# Patient Record
Sex: Female | Born: 1945 | Race: Black or African American | Hispanic: No | Marital: Single | Smoking: Never smoker
Health system: Southern US, Community
[De-identification: ages and names within clinical notes are randomized; demographics above are authoritative.]

## PROBLEM LIST (undated history)

## (undated) DIAGNOSIS — I639 Cerebral infarction, unspecified: Secondary | ICD-10-CM

## (undated) DIAGNOSIS — Z7901 Long term (current) use of anticoagulants: Secondary | ICD-10-CM

## (undated) DIAGNOSIS — M8580 Other specified disorders of bone density and structure, unspecified site: Secondary | ICD-10-CM

## (undated) DIAGNOSIS — D649 Anemia, unspecified: Secondary | ICD-10-CM

## (undated) DIAGNOSIS — Z87442 Personal history of urinary calculi: Secondary | ICD-10-CM

## (undated) DIAGNOSIS — D219 Benign neoplasm of connective and other soft tissue, unspecified: Secondary | ICD-10-CM

## (undated) DIAGNOSIS — J309 Allergic rhinitis, unspecified: Secondary | ICD-10-CM

## (undated) DIAGNOSIS — E7800 Pure hypercholesterolemia, unspecified: Secondary | ICD-10-CM

## (undated) DIAGNOSIS — I4891 Unspecified atrial fibrillation: Secondary | ICD-10-CM

## (undated) DIAGNOSIS — M1990 Unspecified osteoarthritis, unspecified site: Secondary | ICD-10-CM

## (undated) HISTORY — PX: COLONOSCOPY: SHX174

## (undated) HISTORY — PX: LAMINECTOMY: SHX219

## (undated) HISTORY — DX: Unspecified osteoarthritis, unspecified site: M19.90

## (undated) HISTORY — DX: Benign neoplasm of connective and other soft tissue, unspecified: D21.9

## (undated) HISTORY — PX: EYE SURGERY: SHX253

## (undated) HISTORY — DX: Other specified disorders of bone density and structure, unspecified site: M85.80

## (undated) HISTORY — DX: Anemia, unspecified: D64.9

## (undated) HISTORY — DX: Pure hypercholesterolemia, unspecified: E78.00

## (undated) HISTORY — PX: BACK SURGERY: SHX140

## (undated) HISTORY — DX: Cerebral infarction, unspecified: I63.9

## (undated) HISTORY — DX: Allergic rhinitis, unspecified: J30.9

## (undated) HISTORY — DX: Unspecified atrial fibrillation: I48.91

---

## 2005-11-03 ENCOUNTER — Ambulatory Visit: Payer: Self-pay | Admitting: Obstetrics and Gynecology

## 2005-12-01 ENCOUNTER — Ambulatory Visit: Payer: Self-pay | Admitting: Obstetrics and Gynecology

## 2007-11-14 ENCOUNTER — Ambulatory Visit: Payer: Self-pay | Admitting: Obstetrics and Gynecology

## 2008-06-05 ENCOUNTER — Ambulatory Visit: Payer: Self-pay | Admitting: Unknown Physician Specialty

## 2009-02-04 ENCOUNTER — Ambulatory Visit: Payer: Self-pay | Admitting: Internal Medicine

## 2009-02-27 ENCOUNTER — Ambulatory Visit: Payer: Self-pay | Admitting: Internal Medicine

## 2009-03-07 ENCOUNTER — Ambulatory Visit: Payer: Self-pay | Admitting: Internal Medicine

## 2009-04-06 ENCOUNTER — Ambulatory Visit: Payer: Self-pay | Admitting: Internal Medicine

## 2009-04-15 ENCOUNTER — Ambulatory Visit: Payer: Self-pay | Admitting: Obstetrics and Gynecology

## 2012-01-28 ENCOUNTER — Ambulatory Visit: Payer: Self-pay | Admitting: Obstetrics and Gynecology

## 2013-03-28 ENCOUNTER — Ambulatory Visit: Payer: Self-pay | Admitting: Family Medicine

## 2013-08-25 ENCOUNTER — Ambulatory Visit: Payer: Self-pay | Admitting: Gastroenterology

## 2014-02-28 DIAGNOSIS — E7800 Pure hypercholesterolemia, unspecified: Secondary | ICD-10-CM | POA: Insufficient documentation

## 2014-02-28 DIAGNOSIS — I639 Cerebral infarction, unspecified: Secondary | ICD-10-CM

## 2014-02-28 DIAGNOSIS — D649 Anemia, unspecified: Secondary | ICD-10-CM

## 2014-02-28 HISTORY — DX: Pure hypercholesterolemia, unspecified: E78.00

## 2014-02-28 HISTORY — DX: Anemia, unspecified: D64.9

## 2014-02-28 HISTORY — DX: Cerebral infarction, unspecified: I63.9

## 2014-04-25 ENCOUNTER — Ambulatory Visit: Payer: Self-pay | Admitting: Family Medicine

## 2014-12-07 HISTORY — PX: OOPHORECTOMY: SHX86

## 2015-03-21 ENCOUNTER — Inpatient Hospital Stay
Admit: 2015-03-21 | Disposition: A | Discharge status: Skilled Nursing Facility | Payer: Self-pay | Attending: Surgery | Admitting: Surgery

## 2015-03-21 HISTORY — PX: ORIF FEMUR FRACTURE: SHX2119

## 2015-03-21 LAB — BASIC METABOLIC PANEL
Anion Gap: 6 — ABNORMAL LOW (ref 7–16)
BUN: 19 mg/dL
CREATININE: 0.83 mg/dL
Calcium, Total: 8.7 mg/dL — ABNORMAL LOW
Chloride: 106 mmol/L
Co2: 27 mmol/L
EGFR (African American): >60
EGFR (Non-African Amer.): >60
Glucose: 120 mg/dL — ABNORMAL HIGH
Potassium: 4.2 mmol/L
Sodium: 139 mmol/L

## 2015-03-21 LAB — CBC
HCT: 39 % (ref 35.0–47.0)
HGB: 12.4 g/dL (ref 12.0–16.0)
MCH: 28.2 pg (ref 26.0–34.0)
MCHC: 31.7 g/dL — AB (ref 32.0–36.0)
MCV: 89 fL (ref 80–100)
Platelet: 149 10*3/uL — ABNORMAL LOW (ref 150–440)
RBC: 4.38 10*6/uL (ref 3.80–5.20)
RDW: 13.3 % (ref 11.5–14.5)
WBC: 6.3 10*3/uL (ref 3.6–11.0)

## 2015-03-21 LAB — PROTIME-INR
INR: 1.5
Prothrombin Time: 17.9 secs — ABNORMAL HIGH

## 2015-03-21 LAB — APTT: Activated PTT: 25.8 secs (ref 23.6–35.9)

## 2015-03-22 LAB — BASIC METABOLIC PANEL
ANION GAP: 0 — AB (ref 7–16)
BUN: 12 mg/dL
CREATININE: 0.73 mg/dL
Calcium, Total: 7.9 mg/dL — ABNORMAL LOW
Chloride: 113 mmol/L — ABNORMAL HIGH
Co2: 26 mmol/L
EGFR (African American): >60
GLUCOSE: 139 mg/dL — AB
Potassium: 4.3 mmol/L
Sodium: 139 mmol/L

## 2015-03-22 LAB — CBC WITH DIFFERENTIAL/PLATELET
BASOS PCT: 0 %
Basophil #: 0 10*3/uL (ref 0.0–0.1)
Eosinophil #: 0 10*3/uL (ref 0.0–0.7)
Eosinophil %: 0 %
HCT: 28.5 % — AB (ref 35.0–47.0)
HGB: 9.4 g/dL — AB (ref 12.0–16.0)
LYMPHS ABS: 0.8 10*3/uL — AB (ref 1.0–3.6)
LYMPHS PCT: 13 %
MCH: 29 pg (ref 26.0–34.0)
MCHC: 32.8 g/dL (ref 32.0–36.0)
MCV: 88 fL (ref 80–100)
Monocyte #: 0.5 x10 3/mm (ref 0.2–0.9)
Monocyte %: 8.3 %
Neutrophil #: 5 10*3/uL (ref 1.4–6.5)
Neutrophil %: 78.7 %
Platelet: 109 10*3/uL — ABNORMAL LOW (ref 150–440)
RBC: 3.23 10*6/uL — ABNORMAL LOW (ref 3.80–5.20)
RDW: 13.4 % (ref 11.5–14.5)
WBC: 6.3 10*3/uL (ref 3.6–11.0)

## 2015-03-22 LAB — PROTIME-INR
INR: 1.9
Prothrombin Time: 21.5 secs — ABNORMAL HIGH

## 2015-03-23 LAB — CBC WITH DIFFERENTIAL/PLATELET
Basophil #: 0 10*3/uL (ref 0.0–0.1)
Basophil %: 0.4 %
Eosinophil #: 0 10*3/uL (ref 0.0–0.7)
Eosinophil %: 0.3 %
HCT: 28.6 % — ABNORMAL LOW (ref 35.0–47.0)
HGB: 9.2 g/dL — AB (ref 12.0–16.0)
Lymphocyte #: 2.4 10*3/uL (ref 1.0–3.6)
Lymphocyte %: 34.7 %
MCH: 28.8 pg (ref 26.0–34.0)
MCHC: 32.2 g/dL (ref 32.0–36.0)
MCV: 89 fL (ref 80–100)
MONO ABS: 0.6 x10 3/mm (ref 0.2–0.9)
Monocyte %: 8.2 %
NEUTROS ABS: 3.9 10*3/uL (ref 1.4–6.5)
NEUTROS PCT: 56.4 %
Platelet: 110 10*3/uL — ABNORMAL LOW (ref 150–440)
RBC: 3.2 10*6/uL — ABNORMAL LOW (ref 3.80–5.20)
RDW: 14 % (ref 11.5–14.5)
WBC: 6.9 10*3/uL (ref 3.6–11.0)

## 2015-03-23 LAB — PROTIME-INR
INR: 3.1
Prothrombin Time: 32.1 secs — ABNORMAL HIGH

## 2015-03-24 LAB — CBC WITH DIFFERENTIAL/PLATELET
BASOS PCT: 0.4 %
Basophil #: 0 10*3/uL (ref 0.0–0.1)
EOS ABS: 0 10*3/uL (ref 0.0–0.7)
Eosinophil %: 0.4 %
HCT: 25.8 % — ABNORMAL LOW (ref 35.0–47.0)
HGB: 8.3 g/dL — ABNORMAL LOW (ref 12.0–16.0)
LYMPHS PCT: 27.5 %
Lymphocyte #: 1.8 10*3/uL (ref 1.0–3.6)
MCH: 28.5 pg (ref 26.0–34.0)
MCHC: 32.4 g/dL (ref 32.0–36.0)
MCV: 88 fL (ref 80–100)
Monocyte #: 0.7 x10 3/mm (ref 0.2–0.9)
Monocyte %: 10.3 %
Neutrophil #: 3.9 10*3/uL (ref 1.4–6.5)
Neutrophil %: 61.4 %
Platelet: 106 10*3/uL — ABNORMAL LOW (ref 150–440)
RBC: 2.93 10*6/uL — AB (ref 3.80–5.20)
RDW: 13.6 % (ref 11.5–14.5)
WBC: 6.4 10*3/uL (ref 3.6–11.0)

## 2015-03-24 LAB — PROTIME-INR
INR: 2.6
PROTHROMBIN TIME: 27.5 s — AB

## 2015-03-25 ENCOUNTER — Encounter
Admit: 2015-03-25 | Disposition: A | Discharge status: Home / Self Care | Payer: Self-pay | Attending: Internal Medicine | Admitting: Internal Medicine

## 2015-03-25 DIAGNOSIS — S72309A Unspecified fracture of shaft of unspecified femur, initial encounter for closed fracture: Secondary | ICD-10-CM | POA: Insufficient documentation

## 2015-03-25 LAB — PROTIME-INR
INR: 1.8
PROTHROMBIN TIME: 20.8 s — AB

## 2015-03-25 LAB — HEMOGLOBIN: HGB: 9.3 g/dL — AB (ref 12.0–16.0)

## 2015-03-28 LAB — PROTIME-INR
INR: 1.7
Prothrombin Time: 19.9 secs — ABNORMAL HIGH

## 2015-04-02 LAB — PROTIME-INR
INR: 1.8
Prothrombin Time: 20.7 secs — ABNORMAL HIGH

## 2015-04-04 LAB — PROTIME-INR
INR: 2
PROTHROMBIN TIME: 23.1 s — AB

## 2015-04-07 NOTE — Op Note (Signed)
PATIENT NAME:  Lauren Brooks, Lauren Brooks MR#:  [id number] DATE OF BIRTH:  [birth year]  DATE OF PROCEDURE:  03/21/2015  PREOPERATIVE DIAGNOSIS:  Closed comminuted acute left femoral shaft fracture.   POSTOPERATIVE DIAGNOSIS:  Closed comminuted acute left femoral shaft fracture.  PROCEDURE:  Trochanteric femoral nailing of left femoral shaft fracture.  SURGEON:   Pascal Lux, M.D.   ANESTHESIA:  General endotracheal.   FINDINGS:  As noted above.   COMPLICATIONS:  None.   ESTIMATED BLOOD LOSS:  150 mL.  TOTAL FLUIDS:  1500 mL crystalloid.     URINE OUTPUT:  1300 mL  TOURNIQUET:   None.   DRAINS:  None.  CLOSURE:  Staples.   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  She was unable to bear weight.  She was brought to the Emergency Room where x-rays demonstrated a closed comminuted fracture of the mid shaft of her left femur.  She presents at this time for definitive management of her injury.   PROCEDURE:  The patient was brought into the operating room and lain in the supine position.  After adequate general endotracheal intubation and anesthesia were obtained, the patient was repositioned on the fracture table so that her right leg was placed in a flexed and abducted position over the well leg holder while the left leg was placed in longitudinal traction.  The adequacy of our traction and fracture reduction was verified fluoroscopically in AP and lateral projections and found to be excellent.  The lateral aspect of the left hip and thigh were prepped with ChloraPrep solution before being draped sterilely.  Preoperative antibiotics were administered.  Several spots were taken using fluoroscopy in AP and lateral projections to best identify the entry point.  An approximately 4 cm incision was made approximately 4  fingerbreadths above the greater trochanter. The incision was carried down through the subcutaneous tissues.  The gluteal fascia was penetrated to provide access to the tip of the trochanter. The guidewire was positioned using fluoroscopic imaging in AP and lateral projections.  Once the appropriate site was identified, the guidewire was advanced to the level of the lesser trochanter.  It was overreamed using the triple step reamer under fluoroscopic visualization as well.  The beaded guidewire was passed down the proximal femur to the fracture site.  After several attempts, the beaded guidewire was able to be passed across the fracture site into the distal fragment and advanced to the distal femur.  The adequacy of fracture reduction and guidewire position again was verified fluoroscopically in AP and lateral projections and found to be excellent.  The guidewire was overreamed beginning with an 8 mm reamer progressing to a 12.5 mm reamer.  This provided excellent circumferential chatter.  Therefore, the 11 mm nail was selected.  Prior to reaming, the nail length had been measured and found to be 380 mm.  The 11 x 380 mm nail was advanced down the femur and across the fracture site under fluoroscopic visualization.  Once it was passed across the fracture site safely, it was advanced the rest of the way.  The depth of the nail placement was adjusted so that the femoral neck lag screw was in the right position.  Once this is verified, a separate stab incision was made over the lateral aspect of the thigh to allow  access for the guide to position the lag screw.  A guidewire was drilled up through the femoral neck into the femoral head to rest within 7-8 mm of subchondral bone.  The adequacy of pin position again was verified fluoroscopically in AP and lateral projections and found to be excellent.  The pin was measured and found to be 90 mm.   The pin was overreamed before the 90 mm lag screw was inserted and advanced  to the appropriate depth.  Again, this was verified fluoroscopically in AP and lateral projections.  The locking screw was tightened fully then backed off a quarter turn before the guide construct was removed.  Trochanteric femoral nail position and lag screw position were verified fluoroscopically in AP and lateral projections and found to be excellent.  The fracture site again maintained excellent reduction and appeared to be well reduced rotationally.   The C-arm was repositioned to provide "perfect circles" in the distal femur.  Each of the two interlocking screws were drilled, measured, then tightened securely.  The adequacy of screw position again was verified fluoroscopically in AP and lateral projections and found to be excellent.  All of the wounds were copiously irrigated with bacitracin saline solution before the deeper subcutaneous tissues were closed using 2-0 Vicryl interrupted sutures.  The skin was closed using staples.  Sterile bulky dressings were applied to all wounds before the patient was awakened, extubated, and returned to the recovery room in satisfactory condition after tolerating the procedure well.  Of note, a Foley catheter was placed at the end of the case and 1300 mL was obtained.      ____________________________ Lenna Sciara. Dorien Chihuahua, MD [id number] D: 03/21/2015 18:25:00 ET T: 03/21/2015 18:35:55 ET JOB#: [id number]  cc: Pascal Lux, MD, <Dictator> Pascal Lux MD ELECTRONICALLY SIGNED 03/26/2015 14:53

## 2015-04-07 NOTE — Consult Note (Signed)
PATIENT NAME:  Lauren Brooks, Lauren Brooks MR#:  [id number] DATE OF BIRTH:  Jan 06, 1946 ACCOUNT NUMBER:  [id number] DATE OF ADMISSION:  03/21/2015.  CONSULTATION AND HISTORY AND PHYSICAL  DATE OF CONSULTATION:  03/21/2015.  REFERRED BY:  Dr. Joni Fears.  CONSULTING PHYSICIAN:  Pascal Lux, MD  REASON FOR CONSULTATION:  I have been asked by Dr. Joni Fears to evaluate this pleasant woman for a left thigh injury.   HISTORY OF PRESENT ILLNESS:  Briefly, she is a 69 year old female with a history of hypercholesterolemia, but in otherwise excellent health who works as an Chief Technology Officer.  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  She was unable to ambulate.  She was brought to the Emergency Room where x-rays demonstrated a comminuted midshaft fracture of her left femur. The patient denies any associated injuries nor did she note any loss of consciousness as a result of the fall.  She also denies any chest pain, lightheadedness, dizziness, or shortness of breath that may have precipitated her fall.  She denies any numbness or paresthesias down her leg but does complain of significant pain.   PAST MEDICAL HISTORY:  As noted above.  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  She has not had any subsequent events.   PAST SURGICAL HISTORY:  Noncontributory.   ALLERGIES:  She has no known drug allergies.   MEDICATIONS ON ADMISSION:  Include Lipitor 10 mg p.o. q. day, a multivitamin q. day, and Coumadin, she takes 7 mg every Monday, Wednesday, Friday, and 6 mg every Tuesday, Thursday, Saturday and Sunday.    HABITS:  She does not smoke or drink.   REVIEW OF SYSTEMS:  As noted above, otherwise noncontributory.  She denies any chest pain, shortness of breath, nausea, vomiting, diarrhea, constipation, blood in her stool, or burning with  urination.    PHYSICAL EXAMINATION:  GENERAL:  We have a pleasant overweight middle-aged female appearing younger than her stated age.  She is in some distress.  She is alert and oriented x 3.  HEENT:  Normocephalic, atraumatic.  Pupils equal, round, reactive to light.  Extraocular movements are intact.  Ears, nose, and throat are within normal limits.  NECK:  Supple and without adenopathy.  LUNGS:  Clear.  CARDIOVASCULAR:  Reveals a regular rate and rhythm without murmurs.  ABDOMEN:  Benign.  ORTHOPEDIC:  Examination limited to the left lower extremity.  The left thigh is obviously shortened and angulated abnormally.  There is moderate swelling of the thigh as well.  She has pain with any attempt at active or passive motion of the leg as well as to palpation.  The skin overlying the thigh shows no evidence for lacerations, abrasions, ecchymosis, or erythema.  She is neurovascularly intact to the left foot and lower leg.    X-RAY DATA:  X-rays of the left femur and pelvis are available for review.  The findings are as described above.   ADMISSION LABORATORY DATA:  Notable for a slightly elevated glucose at 120.  Her Chem-7 otherwise is unremarkable.  Her white count is 6.3.  She has a hematocrit of 39.0 and a hemoglobin of 12.4.  Her platelet count is 149,000.  Her INR is 1.5 with a PT of 17.9.   IMPRESSION:  Displaced comminuted left femoral shaft fracture.   PLAN:  The treatment options are discussed with the patient and her family, specifically trochanteric femoral nailing of the left femoral shaft fracture.  This procedure has been discussed in detail as have the potential risks (including bleeding, infection, nerve and/or blood vessel injury, persistent or recurrent pain, malunion and/or nonunion, need for further surgery, blood clots, strokes, heart attacks and/or arrhythmias, etc.) and benefits.  The patient states her understanding and wishes to proceed.  A consent has been signed.   Thank  you for asking me to participate in the care of this most pleasant woman.  I will be happy to keep you abreast of her progress.    ____________________________ Lenna Sciara. Dorien Chihuahua, MD [id number] D: 03/21/2015 18:35:00 ET T: 03/21/2015 18:52:31 ET JOB#: [id number]  cc: Pascal Lux, MD, <Dictator> Pascal Lux MD ELECTRONICALLY SIGNED 03/26/2015 14:52

## 2015-04-07 NOTE — Discharge Summary (Signed)
PATIENT NAME:  Lauren Brooks, Lauren Brooks MR#:  [id number] DATE OF BIRTH:  July 15, 1946  DATE OF ADMISSION:  03/21/2015 DATE OF DISCHARGE:  03/25/2015  ADMITTING DIAGNOSIS: Fracture left femur.  DISCHARGE DIAGNOSIS:  Fracture left femur.   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 She was unable to ambulate. She was noted to have immediate pain to the left hip. Subsequently, she was brought to [facility] ER via EMS. X-rays taken in the Emergency Room demonstrated a comminuted midshaft fracture of the left femur. The patient had denied any other associated injuries, nor did she note any loss of consciousness as a result of the fall. After the risks and benefits of surgical intervention were discussed with the patient, the patient agreed to have surgery. She subsequently was admitted to the hospital.  PROCEDURE:  Intertrochanteric femoral nailing of left femur shaft fracture.   IMPLANTS UTILIZED: 11 x 380 mm femoral nail, a 90 mm lag screw was used.   HOSPITAL COURSE:  The patient tolerated the procedure very well. She had no complications. She was then taken to the PACU where she was stabilized and then transferred to the orthopedic floor. The patient began receiving Coumadin 6 mg every 5 hours as well as substituting with Lovenox 30 mg every 12 hours. Once she was therapeutic between 2 and 3, the Lovenox was discontinued. Upon being discharged, she was running between 1.8 and 2.6. She was placed back on her normal regimen of Coumadin that she was on prior to admission. The patient was also fitted with the AV-I compression foot pumps bilaterally set at 80 mmHg.  She was fitted with TED stockings bilaterally. These were allowed to be removed 1 hour per 8 hour shift. Heels were elevated off the bed using rolled towels. She had  no evidence of any DVTs. Negative Homan sign.   The patient's vital signs have been stable. She has been afebrile. Hemodynamically, she was stable and no transfusions gave were given. Upon being discharged, hemoglobin was 9.3. She has denied any chest pains or shortness of breath.   Physical therapy was initiated on day 1 for gait training and transfers. She has been very motivated, but progressing with therapy has been slow. There have been no complications. Occupational therapy was also initiated on day 1 for activities of daily living and assistive devices.   The patient's dressing was changed regularly as needed. She was noted to have serosanguineous drainage secondary to the Coumadin. There was no bloody drainage noted. Some bruising and swelling of the thigh was noted.   The patient is being discharged to the skilled nursing facility in improved stable condition.  She may do partial weight-bearing to the left femur. She will continue with thigh-high TED stockings to both legs.  These are allowed to be removed 1 hour per 8 hour shift. Elevate the heels off the bed. Incentive spirometer every 1 hour while awake. Encourage cough, deep breathing every 2 hours while awake. She is placed on a regular diet. Ice pack to the left hip as needed. She was instructed on wound care. She would need to follow up in the [facility] in 2 weeks, sooner if any temperatures of 101.5 or greater or excessive bleeding.   MEDICATIONS:  Lipitor 10 mg daily, Surfak Joint 40 mg daily,  Senokot-S 1 tablet b.i.d., pantoprazole 40 mg b.i.d., Coumadin 6 mg every 5:00 p.m., Dulcolax suppository 10 mg rectally daily p.r.n. for constipation if no results with Milk of Magnesia. Milk of Magnesia 30 mL b.i.d., Roxicodone 5 to 10 mg every 4 to 6 hours p.r.n. for pain, Tramadol 50 to 100 mg every 4 to 6 hours p.r.n. for pain, 1 mg Coumadin once a day on Tuesday, Thursday, Saturday, and Sunday along with the 6 mg of Coumadin.   PAST  MEDICAL HISTORY: CVA approximately 18 years ago for which she has been on Coumadin and hyperlipidemia.   ____________________________ Vance Peper, PA [id number] D: 03/25/2015 13:57:56 ET T: 03/25/2015 14:48:52 ET JOB#: [id number]  cc: Vance Peper, PA, <Dictator> JON WOLFE PA ELECTRONICALLY SIGNED 03/26/2015 11:10

## 2015-04-08 ENCOUNTER — Encounter
Admission: RE | Admit: 2015-04-08 | Discharge: 2015-04-08 | Disposition: A | Discharge status: Home / Self Care | Payer: Medicare PPO | Source: Ambulatory Visit | Attending: Internal Medicine | Admitting: Internal Medicine

## 2015-04-08 DIAGNOSIS — I4891 Unspecified atrial fibrillation: Secondary | ICD-10-CM | POA: Insufficient documentation

## 2015-04-09 DIAGNOSIS — I4891 Unspecified atrial fibrillation: Secondary | ICD-10-CM | POA: Diagnosis not present

## 2015-04-09 LAB — PROTIME-INR
INR: 1.98
PROTHROMBIN TIME: 22.7 s — AB (ref 11.4–15.0)

## 2015-04-18 ENCOUNTER — Other Ambulatory Visit
Admission: RE | Admit: 2015-04-18 | Discharge: 2015-04-18 | Disposition: A | Discharge status: Home / Self Care | Payer: Medicare PPO | Source: Skilled Nursing Facility | Attending: Internal Medicine | Admitting: Internal Medicine

## 2015-04-18 DIAGNOSIS — I4891 Unspecified atrial fibrillation: Secondary | ICD-10-CM | POA: Insufficient documentation

## 2015-04-18 LAB — PROTIME-INR
INR: 3.09
PROTHROMBIN TIME: 31.9 s — AB (ref 11.4–15.0)

## 2015-04-23 ENCOUNTER — Other Ambulatory Visit
Admission: RE | Admit: 2015-04-23 | Discharge: 2015-04-23 | Disposition: A | Discharge status: Home / Self Care | Payer: Medicare PPO | Source: Skilled Nursing Facility | Attending: Internal Medicine | Admitting: Internal Medicine

## 2015-04-23 DIAGNOSIS — I4891 Unspecified atrial fibrillation: Secondary | ICD-10-CM | POA: Insufficient documentation

## 2015-04-23 LAB — PROTIME-INR
INR: 2.55
PROTHROMBIN TIME: 27.5 s — AB (ref 11.4–15.0)

## 2015-04-24 ENCOUNTER — Encounter
Admission: RE | Admit: 2015-04-24 | Discharge: 2015-04-24 | Disposition: A | Discharge status: Home / Self Care | Payer: Medicare PPO | Source: Ambulatory Visit | Attending: Internal Medicine | Admitting: Internal Medicine

## 2015-04-24 DIAGNOSIS — I4891 Unspecified atrial fibrillation: Secondary | ICD-10-CM | POA: Insufficient documentation

## 2015-04-30 ENCOUNTER — Encounter
Admission: RE | Admit: 2015-04-30 | Discharge: 2015-04-30 | Disposition: A | Discharge status: Home / Self Care | Payer: Medicare PPO | Source: Ambulatory Visit | Attending: Internal Medicine | Admitting: Internal Medicine

## 2015-04-30 DIAGNOSIS — I4891 Unspecified atrial fibrillation: Secondary | ICD-10-CM | POA: Insufficient documentation

## 2015-04-30 LAB — PROTIME-INR
INR: 2.78
PROTHROMBIN TIME: 29.4 s — AB (ref 11.4–15.0)

## 2015-05-07 DIAGNOSIS — I4891 Unspecified atrial fibrillation: Secondary | ICD-10-CM | POA: Diagnosis not present

## 2015-05-07 LAB — PROTIME-INR
INR: 3.15
Prothrombin Time: 32.4 seconds — ABNORMAL HIGH (ref 11.4–15.0)

## 2015-05-08 ENCOUNTER — Encounter
Admission: RE | Admit: 2015-05-08 | Discharge: 2015-05-08 | Disposition: A | Discharge status: Home / Self Care | Payer: Medicare PPO | Source: Ambulatory Visit | Attending: Internal Medicine | Admitting: Internal Medicine

## 2015-05-08 DIAGNOSIS — I4891 Unspecified atrial fibrillation: Secondary | ICD-10-CM | POA: Insufficient documentation

## 2015-05-14 DIAGNOSIS — I4891 Unspecified atrial fibrillation: Secondary | ICD-10-CM | POA: Diagnosis present

## 2015-05-14 LAB — PROTIME-INR
INR: 3.14
PROTHROMBIN TIME: 32.3 s — AB (ref 11.4–15.0)

## 2015-05-21 LAB — PROTIME-INR
INR: 2.91
PROTHROMBIN TIME: 30.5 s — AB (ref 11.4–15.0)

## 2015-05-28 DIAGNOSIS — I4891 Unspecified atrial fibrillation: Secondary | ICD-10-CM | POA: Diagnosis not present

## 2015-05-28 LAB — PROTIME-INR
INR: 2.55
PROTHROMBIN TIME: 27.5 s — AB (ref 11.4–15.0)

## 2015-06-04 DIAGNOSIS — I4891 Unspecified atrial fibrillation: Secondary | ICD-10-CM | POA: Diagnosis not present

## 2015-06-04 LAB — PROTIME-INR
INR: 2.89
PROTHROMBIN TIME: 30.3 s — AB (ref 11.4–15.0)

## 2015-06-07 ENCOUNTER — Encounter
Admission: RE | Admit: 2015-06-07 | Discharge: 2015-06-07 | Disposition: A | Discharge status: Home / Self Care | Payer: Medicare PPO | Source: Ambulatory Visit | Attending: Internal Medicine | Admitting: Internal Medicine

## 2015-06-07 DIAGNOSIS — I4891 Unspecified atrial fibrillation: Secondary | ICD-10-CM | POA: Insufficient documentation

## 2015-06-11 DIAGNOSIS — I4891 Unspecified atrial fibrillation: Secondary | ICD-10-CM | POA: Diagnosis present

## 2015-06-11 LAB — PROTIME-INR
INR: 1.36
PROTHROMBIN TIME: 17 s — AB (ref 11.4–15.0)

## 2015-06-12 DIAGNOSIS — I4891 Unspecified atrial fibrillation: Secondary | ICD-10-CM | POA: Diagnosis not present

## 2015-06-12 LAB — PROTIME-INR
INR: 1.36
PROTHROMBIN TIME: 17 s — AB (ref 11.4–15.0)

## 2015-06-14 ENCOUNTER — Other Ambulatory Visit
Admission: RE | Admit: 2015-06-14 | Discharge: 2015-06-14 | Disposition: A | Discharge status: Home / Self Care | Payer: Medicare PPO | Source: Ambulatory Visit | Attending: Gerontology | Admitting: Gerontology

## 2015-06-14 DIAGNOSIS — I4891 Unspecified atrial fibrillation: Secondary | ICD-10-CM | POA: Diagnosis present

## 2015-06-14 LAB — PROTIME-INR
INR: 2.08
PROTHROMBIN TIME: 23.5 s — AB (ref 11.4–15.0)

## 2015-06-26 ENCOUNTER — Other Ambulatory Visit: Payer: Self-pay | Admitting: Nurse Practitioner

## 2015-06-26 DIAGNOSIS — Z1231 Encounter for screening mammogram for malignant neoplasm of breast: Secondary | ICD-10-CM

## 2015-08-08 DIAGNOSIS — M8580 Other specified disorders of bone density and structure, unspecified site: Secondary | ICD-10-CM

## 2015-08-08 HISTORY — DX: Other specified disorders of bone density and structure, unspecified site: M85.80

## 2015-08-13 ENCOUNTER — Ambulatory Visit: Payer: Medicare PPO

## 2015-09-03 ENCOUNTER — Ambulatory Visit
Admission: RE | Admit: 2015-09-03 | Discharge: 2015-09-03 | Disposition: A | Discharge status: Home / Self Care | Payer: Medicare PPO | Source: Ambulatory Visit | Attending: Nurse Practitioner | Admitting: Nurse Practitioner

## 2015-09-03 DIAGNOSIS — Z1231 Encounter for screening mammogram for malignant neoplasm of breast: Secondary | ICD-10-CM | POA: Insufficient documentation

## 2015-09-19 ENCOUNTER — Ambulatory Visit
Admission: EM | Admit: 2015-09-19 | Discharge: 2015-09-19 | Disposition: A | Discharge status: Home / Self Care | Payer: Medicare PPO | Attending: Family Medicine | Admitting: Family Medicine

## 2015-09-19 ENCOUNTER — Telehealth: Payer: Self-pay | Admitting: *Deleted

## 2015-09-19 ENCOUNTER — Ambulatory Visit
Admit: 2015-09-19 | Discharge: 2015-09-19 | Disposition: A | Discharge status: Home / Self Care | Payer: Medicare PPO | Attending: Family Medicine | Admitting: Family Medicine

## 2015-09-19 DIAGNOSIS — Z8742 Personal history of other diseases of the female genital tract: Secondary | ICD-10-CM

## 2015-09-19 DIAGNOSIS — R1031 Right lower quadrant pain: Secondary | ICD-10-CM

## 2015-09-19 DIAGNOSIS — Z86018 Personal history of other benign neoplasm: Secondary | ICD-10-CM

## 2015-09-19 DIAGNOSIS — R1900 Intra-abdominal and pelvic swelling, mass and lump, unspecified site: Secondary | ICD-10-CM | POA: Diagnosis not present

## 2015-09-19 HISTORY — DX: Cerebral infarction, unspecified: I63.9

## 2015-09-19 LAB — CBC WITH DIFFERENTIAL/PLATELET
BASOS ABS: 0.1 10*3/uL (ref 0–0.1)
BASOS PCT: 1 %
Eosinophils Absolute: 0 10*3/uL (ref 0–0.7)
Eosinophils Relative: 1 %
HEMATOCRIT: 39.5 % (ref 35.0–47.0)
Hemoglobin: 12.8 g/dL (ref 12.0–16.0)
Lymphocytes Relative: 7 %
Lymphs Abs: 0.5 10*3/uL — ABNORMAL LOW (ref 1.0–3.6)
MCH: 28.5 pg (ref 26.0–34.0)
MCHC: 32.3 g/dL (ref 32.0–36.0)
MCV: 88 fL (ref 80.0–100.0)
MONO ABS: 0.2 10*3/uL (ref 0.2–0.9)
Monocytes Relative: 3 %
Neutro Abs: 5.6 10*3/uL (ref 1.4–6.5)
Neutrophils Relative %: 88 %
Platelets: 161 10*3/uL (ref 150–440)
RBC: 4.49 MIL/uL (ref 3.80–5.20)
RDW: 13.3 % (ref 11.5–14.5)
WBC: 6.4 10*3/uL (ref 3.6–11.0)

## 2015-09-19 LAB — COMPREHENSIVE METABOLIC PANEL
ALT: 24 U/L (ref 14–54)
ANION GAP: 10 (ref 5–15)
AST: 25 U/L (ref 15–41)
Albumin: 4.7 g/dL (ref 3.5–5.0)
Alkaline Phosphatase: 54 U/L (ref 38–126)
BILIRUBIN TOTAL: 0.6 mg/dL (ref 0.3–1.2)
BUN: 19 mg/dL (ref 6–20)
CO2: 26 mmol/L (ref 22–32)
Calcium: 9.6 mg/dL (ref 8.9–10.3)
Chloride: 102 mmol/L (ref 101–111)
Creatinine, Ser: 0.79 mg/dL (ref 0.44–1.00)
GFR calc non Af Amer: >60 mL/min (ref >60)
Glucose, Bld: 130 mg/dL — ABNORMAL HIGH (ref 65–99)
Potassium: 4.1 mmol/L (ref 3.5–5.1)
Sodium: 138 mmol/L (ref 135–145)
TOTAL PROTEIN: 8 g/dL (ref 6.5–8.1)

## 2015-09-19 LAB — URINALYSIS COMPLETE WITH MICROSCOPIC (ARMC ONLY)
BILIRUBIN URINE: NEGATIVE
Bacteria, UA: NONE SEEN — AB
Glucose, UA: NEGATIVE mg/dL
Leukocytes, UA: NEGATIVE
NITRITE: NEGATIVE
PH: 6.5 (ref 5.0–8.0)
Protein, ur: NEGATIVE mg/dL
Specific Gravity, Urine: 1.02 (ref 1.005–1.030)

## 2015-09-19 LAB — PROTIME-INR
INR: 1.88
PROTHROMBIN TIME: 21.4 s — AB (ref 11.4–15.0)

## 2015-09-19 LAB — AMYLASE: AMYLASE: 94 U/L (ref 28–100)

## 2015-09-19 LAB — LIPASE, BLOOD: Lipase: 38 U/L (ref 22–51)

## 2015-09-19 LAB — OCCULT BLOOD X 1 CARD TO LAB, STOOL: FECAL OCCULT BLD: POSITIVE — AB

## 2015-09-19 MED ORDER — IOHEXOL 350 MG/ML SOLN
100.0000 mL | Freq: Once | INTRAVENOUS | Status: AC | PRN
  Administered 2015-09-19: 100 mL via INTRAVENOUS

## 2015-09-19 MED ORDER — MELOXICAM 15 MG PO TABS: 15.0000 mg | ORAL_TABLET | Freq: Every day | ORAL | Status: DC

## 2015-09-19 MED ORDER — ONDANSETRON 8 MG PO TBDP: 8.0000 mg | ORAL_TABLET | Freq: Three times a day (TID) | ORAL | Status: DC | PRN

## 2015-09-19 MED ORDER — KETOROLAC TROMETHAMINE 30 MG/ML IJ SOLN
30.0000 mg | Freq: Once | INTRAMUSCULAR | Status: AC
  Administered 2015-09-19: 30 mg via INTRAVENOUS

## 2015-09-19 MED ORDER — ONDANSETRON 8 MG PO TBDP
8.0000 mg | ORAL_TABLET | Freq: Once | ORAL | Status: AC
  Administered 2015-09-19: 8 mg via ORAL

## 2015-09-19 NOTE — ED Provider Notes (Signed)
CSN: [id number]     Arrival date & time 09/19/15  4944 History   First MD Initiated Contact with Patient 09/19/15 [phone]    Nurses notes were reviewed.  Chief Complaint  Patient presents with  . Emesis  . Abdominal Pain     Patient reports having nausea and vomiting started last night. States she also warmed she didn't care for yourself up last night and stayed in the cold. She personally she have the vomiting and nausea started about 9:00 last night she had right lower quadrant pain that moved down the right leg as well. The pain doesn't go down the right leg like it used to but still right lower quadrant pain. She's never had any type of abdominal surgery. (Consider location/radiation/quality/duration/timing/severity/associated sxs/prior Treatment) Patient is a 69 y.o. female presenting with vomiting and abdominal pain. The history is provided by the patient and a relative. No language interpreter was used.  Emesis Severity:  Moderate Duration:  1 day Timing:  Rare Quality:  Undigested food and stomach contents Feeding tolerance: Nothing. Progression:  Unchanged Chronicity:  New Recent urination:  Increased (She reports trouble with frequency lately) Relieved by:  Nothing Ineffective treatments:  None tried Associated symptoms: abdominal pain, chills, fever and myalgias   Associated symptoms: no arthralgias, no cough, no diarrhea, no headaches, no sore throat and no URI   Abdominal pain:    Location:  RLQ   Quality:  Burning, cramping, sharp, shooting and stabbing   Severity:  Moderate   Onset quality:  Sudden   Duration:  1 day   Progression:  Worsening   Chronicity:  New Fever:    Temp source:  Subjective   Progression:  Partially resolved Risk factors: no alcohol use, no diabetes, not pregnant now, no prior abdominal surgery, no sick contacts, no suspect food intake and no travel to endemic areas   Abdominal Pain Associated symptoms: chills and vomiting   Associated  symptoms: no diarrhea and no sore throat      She had a CVA 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 She is also had a femur fracture this summer requiring surgery. She does not smoke Past Medical History  Diagnosis Date  . CVA (cerebral infarction)    Past Surgical History  Procedure Laterality Date  . Orif femur fracture     Family History  Problem Relation Age of Onset  . Breast cancer Cousin 19   Social History  Substance Use Topics  . Smoking status: None  . Smokeless tobacco: None  . Alcohol Use: None   OB History    No data available     Review of Systems  Constitutional: Positive for chills.  HENT: Negative for sore throat.   Gastrointestinal: Positive for vomiting and abdominal pain. Negative for diarrhea.  Musculoskeletal: Positive for myalgias. Negative for arthralgias.       She reports the pain goes to right lower abdomen down her right leg as well last night. Today she's not having the pain going down the right leg anymore  Neurological: Negative for headaches.  All other systems reviewed and are negative.   Allergies  Review of patient's allergies indicates no known allergies.  Home Medications   Prior to Admission medications   Medication Sig Start Date End Date Taking? Authorizing Provider  atorvastatin (LIPITOR) 10 MG tablet Take 10 mg by mouth daily.   Yes Historical Provider, MD  warfarin (COUMADIN) 5 MG tablet Take 5 mg by mouth daily.   Yes Historical Provider, MD  meloxicam (MOBIC) 15 MG tablet Take 1 tablet (15 mg total) by mouth daily. 09/19/15   Frederich Cha, MD  ondansetron (ZOFRAN ODT) 8 MG disintegrating tablet Take 1 tablet (8 mg total) by mouth every 8 (eight) hours as needed for nausea or vomiting. 09/19/15   Frederich Cha, MD   Meds Ordered and Administered this Visit   Medications  ondansetron (ZOFRAN-ODT) disintegrating tablet 8 mg (8 mg Oral Given 09/19/15 0929)  ketorolac (TORADOL) 30 MG/ML injection 30 mg (30 mg Intravenous Given 09/19/15 1210)     BP 147/74 mmHg  Pulse 72  Temp(Src) 98.4 F (36.9 C) (Oral)  Resp 16  Ht 5\' 4"  (1.626 m)  Wt 216 lb (97.977 kg)  BMI 37.06 kg/m2  SpO2 100% No data found.   Physical Exam  Constitutional: She is oriented to person, place, and time. She appears well-developed.  HENT:  Head: Normocephalic and atraumatic.  Right Ear: External ear normal.  Left Ear: External ear normal.  Mouth/Throat: Oropharynx is clear and moist.  Eyes: Pupils are equal, round, and reactive to light.  Neck: Normal range of motion. Neck supple.  Cardiovascular: Normal rate, regular rhythm and normal heart sounds.   Pulmonary/Chest: Effort normal.  Abdominal: Soft. Bowel sounds are normal. There is no hepatosplenomegaly. There is tenderness in the right lower quadrant. There is tenderness at McBurney's point. There is no CVA tenderness. No hernia. Hernia confirmed negative in the ventral area.    Patient with tenderness in the right lower quadrant of abdomen.  Genitourinary: Rectal exam shows tenderness. Rectal exam shows no external hemorrhoid, no internal hemorrhoid, no mass and anal tone normal. Guaiac positive stool.  Right lower quadrant tenderness elicited on rectal examination.  Musculoskeletal: Normal range of motion. She exhibits no tenderness.  Neurological: She is alert and oriented to person, place, and time. No cranial nerve deficit.  Skin: Skin is warm and dry.  Psychiatric: She has a normal mood and affect. Her behavior is normal.  Vitals reviewed.   ED Course  Procedures (including critical care time)  Labs Review Labs Reviewed  URINALYSIS COMPLETEWITH MICROSCOPIC ([facility] ONLY) - Abnormal; Notable for the following:    Ketones, ur 1+ (*)    Hgb urine dipstick TRACE (*)    Bacteria, UA NONE SEEN (*)    Squamous Epithelial / LPF 0-5 (*)    All other components within normal limits  COMPREHENSIVE METABOLIC PANEL - Abnormal; Notable for the following:    Glucose, Bld 130 (*)    All other  components within normal limits  OCCULT BLOOD X 1 CARD TO LAB, STOOL - Abnormal; Notable for the following:    Fecal Occult Bld POSITIVE (*)    All other components within normal limits  CBC WITH DIFFERENTIAL/PLATELET - Abnormal; Notable for the following:    Lymphs Abs 0.5 (*)    All other components within normal limits  PROTIME-INR - Abnormal; Notable for the following:    Prothrombin Time 21.4 (*)    All other components within normal limits  URINE CULTURE  LIPASE, BLOOD  AMYLASE    Imaging Review Ct Abdomen Pelvis W Contrast  09/19/2015  CLINICAL DATA:  Lower abdominal pain and right leg pain for 1 day, vomiting EXAM: CT ABDOMEN AND PELVIS WITH CONTRAST TECHNIQUE: Multidetector CT imaging of the abdomen and pelvis was performed using the standard protocol following bolus administration of intravenous contrast. CONTRAST:  168mL OMNIPAQUE IOHEXOL 350 MG/ML SOLN COMPARISON:  None. FINDINGS: Lung bases are unremarkable. Sagittal images  of the spine shows degenerative changes thoracolumbar spine. Enhanced liver shows scattered hepatic cysts the largest in right hepatic lobe measures 1.1 cm. No calcified gallstones are noted within gallbladder. No aortic aneurysm. The pancreas, spleen and adrenal glands are unremarkable. Kidneys are symmetrical in size and enhancement. No hydronephrosis or hydroureter. Delayed renal images shows bilateral renal symmetrical excretion. Bilateral visualized proximal ureter is unremarkable. There is no small bowel obstruction. No ascites or free air. No adenopathy. Normal appendix. No pericecal inflammation. Moderate stool noted in transverse colon and descending colon. Abundant stool noted within rectum. The rectum is distended with stool and gas measures at least 7 cm in diameter. There is a small umbilical hernia containing fat without evidence of acute complication. Small amount of nonspecific fluid is noted within uterus. The uterus is anteflexed. A right fundal  fibroid measures 2.2 cm. There is a large cystic lesion measures in right posterior cul-de-sac which measures -29 Hounsfield units in attenuation. Measures 10 x 9 cm. A tiny peripheral calcification is noted. 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 There is no evidence of thickening of the wall to suggest acute torsion. Clinical correlation is necessary. Correlation with GYN exam and further correlation with MRI or pelvic ultrasound could be performed as clinically warranted. IMPRESSION: 1. There is a cystic lesion probable containing fat and tiny calcification in right pelvis highly suspicious for right ovarian dermoid. No definite evidence of torsion. The lesion measures at least 10 x 9 cm. Correlation with GYN exam is recommended. 2. There is a right fundal uterine fibroid measures about 2.2 cm. 3. Moderate stool noted in transverse colon and descending colon. Abundant stool noted within rectum. 4. Normal appendix.  No pericecal inflammation. 5. No small bowel obstruction. 6. No hydronephrosis or hydroureter. These results were called by telephone at the time of interpretation on 09/19/2015 at 1:10 pm to Dr. Frederich Cha , who verbally acknowledged these results. Electronically Signed   By: Lahoma Crocker M.D.   On: 09/19/2015 13:11   Results for orders placed or performed during the hospital encounter of 09/19/15  Urinalysis complete, with microscopic  Result Value Ref Range   Color, Urine YELLOW YELLOW   APPearance CLEAR CLEAR   Glucose, UA NEGATIVE NEGATIVE mg/dL   Bilirubin Urine NEGATIVE NEGATIVE   Ketones, ur 1+ (A) NEGATIVE mg/dL   Specific Gravity, Urine 1.020 1.005 - 1.030   Hgb urine dipstick TRACE (A) NEGATIVE   pH 6.5 5.0 - 8.0   Protein, ur NEGATIVE NEGATIVE mg/dL   Nitrite NEGATIVE NEGATIVE   Leukocytes, UA NEGATIVE NEGATIVE   RBC / HPF 6-30 <3 RBC/hpf   WBC, UA 0-5 <3 WBC/hpf   Bacteria, UA NONE SEEN (A) RARE   Squamous Epithelial / LPF 0-5 (A) RARE    Comprehensive metabolic panel  Result Value Ref Range   Sodium 138 135 - 145 mmol/L   Potassium 4.1 3.5 - 5.1 mmol/L   Chloride 102 101 - 111 mmol/L   CO2 26 22 - 32 mmol/L   Glucose, Bld 130 (H) 65 - 99 mg/dL   BUN 19 6 - 20 mg/dL   Creatinine, Ser 0.79 0.44 - 1.00 mg/dL   Calcium 9.6 8.9 - 10.3 mg/dL   Total Protein 8.0 6.5 - 8.1 g/dL   Albumin 4.7 3.5 - 5.0 g/dL   AST 25 15 - 41 U/L   ALT 24 14 - 54 U/L   Alkaline Phosphatase 54 38 - 126 U/L   Total  Bilirubin 0.6 0.3 - 1.2 mg/dL   GFR calc non Af Amer >60 >60 mL/min   GFR calc Af Amer >60 >60 mL/min   Anion gap 10 5 - 15  Occult blood card to lab, stool Provider will collect  Result Value Ref Range   Fecal Occult Bld POSITIVE (A) NEGATIVE  CBC with Differential  Result Value Ref Range   WBC 6.4 3.6 - 11.0 K/uL   RBC 4.49 3.80 - 5.20 MIL/uL   Hemoglobin 12.8 12.0 - 16.0 g/dL   HCT 39.5 35.0 - 47.0 %   MCV 88.0 80.0 - 100.0 fL   MCH 28.5 26.0 - 34.0 pg   MCHC 32.3 32.0 - 36.0 g/dL   RDW 13.3 11.5 - 14.5 %   Platelets 161 150 - 440 K/uL   Neutrophils Relative % 88 %   Neutro Abs 5.6 1.4 - 6.5 K/uL   Lymphocytes Relative 7 %   Lymphs Abs 0.5 (L) 1.0 - 3.6 K/uL   Monocytes Relative 3 %   Monocytes Absolute 0.2 0.2 - 0.9 K/uL   Eosinophils Relative 1 %   Eosinophils Absolute 0.0 0 - 0.7 K/uL   Basophils Relative 1 %   Basophils Absolute 0.1 0 - 0.1 K/uL  Lipase, blood  Result Value Ref Range   Lipase 38 22 - 51 U/L  Amylase  Result Value Ref Range   Amylase 94 28 - 100 U/L  Protime-INR  Result Value Ref Range   Prothrombin Time 21.4 (H) 11.4 - 15.0 seconds   INR 1.88     Visual Acuity Review  Right Eye Distance:   Left Eye Distance:   Bilateral Distance:    Right Eye Near:   Left Eye Near:    Bilateral Near:         MDM   1. Right lower quadrant abdominal pain   2. Pelvic mass in female   3. History of uterine fibroid     Patient reports some improvement w/the Zofran.   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 Think this was causing the pain and discomfort and recommend she follow-up with her GYN who is at the [place] [facility] for evaluation and liokely removal of the cyst. She was placed on Mobic and Zofran for nausea so she does not do well with narcotics. She did report the Toradol injection did help significantly with the pain.    Frederich Cha, MD 09/19/15 1410

## 2015-09-19 NOTE — ED Notes (Signed)
Pt resting with eyes closed.

## 2015-09-19 NOTE — ED Notes (Signed)
Vomited 4-5 times during the night. Last vomit at [facility]

## 2015-09-19 NOTE — ED Notes (Signed)
Pt states "I went to bed early because I felt bad, I woke up at midnight and vomited. The pain is in my right lower abd and goes down my right leg."

## 2015-09-19 NOTE — ED Notes (Signed)
Pt resting, states "I already feel better with pain medication." Pt has complete oral contrast and IV started without problem. Daughter at bedside.

## 2015-09-19 NOTE — Telephone Encounter (Signed)
Opened in error

## 2015-09-19 NOTE — Discharge Instructions (Signed)
Pelvic Mass A pelvic mass is an abnormal growth in the pelvis. The pelvis is the area between your hip bones. It includes the bladder and the rectum in males and females, and also the uterus and ovaries in females. CAUSES Many things can cause a pelvic mass, including:  Cancer.  Fibroids of the uterus.  Ovarian cysts.  Infection.  Ectopic pregnancy. SIGNS AND SYMPTOMS Symptoms of a pelvic mass may include:  Cramping.  Nausea.  Diarrhea.  Fever.  Vomiting.  Weakness.  Pain in the pelvis, side, or back.  Weight loss.  Constipation.  Problems with vaginal bleeding, including:  Light or heavy bleeding with or without blood clots.  Irregular menstruation.  Pain with menstruation.  Problems with urination, including:  Frequent urination.  Inability to empty the bladder completely.  Urinating very small amounts.  Pain with urination.  Bloody urine. Some pelvic masses do not cause symptoms. DIAGNOSIS To make a diagnosis, your health care provider will need to learn more about the mass. You may have tests or procedures done, such as:  Blood tests.  X-rays.  Ultrasound.  CT scan.  MRI.  A surgery to look inside of your abdomen with cameras (laparoscopy).  A biopsy that is performed with a needle or during laparoscopy or surgery. In some cases, what seemed like a pelvic mass may actually be something else, such as a mass in one of the organs that are near the pelvis, an infection (abscess) or scar tissue (adhesions) that formed after a surgery. TREATMENT Treatment will depend on the cause of the mass. HOME CARE INSTRUCTIONS What you need to do at home will depend on the cause of the mass. Follow the instructions that your health care provider gives to you. In general:  Keep all follow-up visits as directed by your health care provider. This is important.  Take medicines only as directed by your health care provider.  Follow any restrictions that  are given to you by your health care provider. SEEK MEDICAL CARE IF:  You develop new symptoms. SEEK IMMEDIATE MEDICAL CARE IF:  You vomit bright red blood or vomit material that looks like coffee grounds.  You have blood in your stools, or the stools turn black and tarry.  You have an abnormal or increased amount of vaginal bleeding.  You have a fever.  You develop easy bruising or bleeding.  You develop sudden or worsening pain that is not controlled by your medicine.  You feel worsening weakness, or you have a fainting episode.  You feel that the mass has suddenly gotten larger.  You develop severe bloating in your abdomen or your pelvis.  You cannot pass any urine.  You are unable to have a bowel movement.   This information is not intended to replace advice given to you by your health care provider. Make sure you discuss any questions you have with your health care provider.   Document Released: 03/02/2007 Document Revised: 12/14/2014 Document Reviewed: 07/09/2014 Elsevier Interactive Patient Education 2016 [employer]  Abdominal Pain, Adult Many things can cause belly (abdominal) pain. Most times, the belly pain is not dangerous. Many cases of belly pain can be watched and treated at home. HOME CARE   Do not take medicines that help you go poop (laxatives) unless told to by your doctor.  Only take medicine as told by your doctor.  Eat or drink as told by your doctor. Your doctor will tell you if you should be on a special diet. GET  HELP IF:  You do not know what is causing your belly pain.  You have belly pain while you are sick to your stomach (nauseous) or have runny poop (diarrhea).  You have pain while you pee or poop.  Your belly pain wakes you up at night.  You have belly pain that gets worse or better when you eat.  You have belly pain that gets worse when you eat fatty foods.  You have a fever. GET HELP RIGHT AWAY IF:   The pain does not go  away within 2 hours.  You keep throwing up (vomiting).  The pain changes and is only in the right or left part of the belly.  You have bloody or tarry looking poop. MAKE SURE YOU:   Understand these instructions.  Will watch your condition.  Will get help right away if you are not doing well or get worse.   This information is not intended to replace advice given to you by your health care provider. Make sure you discuss any questions you have with your health care provider.   Document Released: 05/11/2008 Document Revised: 12/14/2014 Document Reviewed: 08/02/2013 Elsevier Interactive Patient Education [employer].

## 2015-09-21 LAB — URINE CULTURE: Culture: 30000

## 2015-09-24 NOTE — ED Notes (Signed)
Discussed lab report w Dr Alveta Heimlich. Per Dr Alveta Heimlich, if patient is symptomatic, may cal in Rx for patient, Amoxicillin 875, 1 tab BID x 10 days, NR. Called and left detailed message for patient to call us.

## 2015-10-16 ENCOUNTER — Ambulatory Visit (INDEPENDENT_AMBULATORY_CARE_PROVIDER_SITE_OTHER): Payer: Medicare PPO | Admitting: Obstetrics and Gynecology

## 2015-10-16 ENCOUNTER — Encounter: Payer: Self-pay | Admitting: Obstetrics and Gynecology

## 2015-10-16 VITALS — BP 125/79 | HR 92 | Ht 65.0 in | Wt 219.1 lb

## 2015-10-16 DIAGNOSIS — Z5181 Encounter for therapeutic drug level monitoring: Secondary | ICD-10-CM | POA: Diagnosis not present

## 2015-10-16 DIAGNOSIS — D259 Leiomyoma of uterus, unspecified: Secondary | ICD-10-CM | POA: Diagnosis not present

## 2015-10-16 DIAGNOSIS — R110 Nausea: Secondary | ICD-10-CM | POA: Diagnosis not present

## 2015-10-16 DIAGNOSIS — Z7901 Long term (current) use of anticoagulants: Secondary | ICD-10-CM | POA: Diagnosis not present

## 2015-10-16 DIAGNOSIS — N83201 Unspecified ovarian cyst, right side: Secondary | ICD-10-CM

## 2015-10-16 DIAGNOSIS — R102 Pelvic and perineal pain: Secondary | ICD-10-CM

## 2015-10-16 NOTE — Patient Instructions (Signed)
You are scheduled for surgery on 07/08/2015.  Nothing to eat after midnight on day prior to surgery.  Do not take any medications unless recommended by your provider on day prior to surgery.  Do not take NSAIDs (Motrin, Aleve) or aspirin 7 days prior to surgery.  You may take Tylenol products for minor aches and pains.  Discontinue Coumadin 1 week prior to surgery You will receive a prescription for pain medications post-operatively.  You will be contacted by phone approximately 1 week prior to surgery to schedule pre-operative appointment.  Please call the office if you have any questions regarding your upcoming surgery.    Diagnostic Laparoscopy A diagnostic laparoscopy is a procedure to diagnose diseases in the abdomen. During the procedure, a thin, lighted, pencil-sized instrument called a laparoscope is inserted into the abdomen through an incision. The laparoscope allows your health care provider to look at the organs inside your body. LET [facility] CARE PROVIDER KNOW ABOUT:  Any allergies you have.  All medicines you are taking, including vitamins, herbs, eye drops, creams, and over-the-counter medicines.  Previous problems you or members of your family have had with the use of anesthetics.  Any blood disorders you have.  Previous surgeries you have had.  Medical conditions you have. RISKS AND COMPLICATIONS  Generally, this is a safe procedure. However, problems can occur, which may include:  Infection.  Bleeding.  Damage to other organs.  Allergic reaction to the anesthetics used during the procedure. BEFORE THE PROCEDURE  Do not eat or drink anything after midnight on the night before the procedure or as directed by your health care provider.  Ask your health care provider about:  Changing or stopping your regular medicines.  Taking medicines such as aspirin and ibuprofen. These medicines can thin your blood. Do not take these medicines before your procedure if your  health care provider instructs you not to.  Plan to have someone take you home after the procedure. PROCEDURE  You may be given a medicine to help you relax (sedative).  You will be given a medicine to make you sleep (general anesthetic).  Your abdomen will be inflated with a gas. This will make your organs easier to see.  Small incisions will be made in your abdomen.  A laparoscope and other small instruments will be inserted into the abdomen through the incisions.  A tissue sample may be removed from an organ in the abdomen for examination.  The instruments will be removed from the abdomen.  The gas will be released.  The incisions will be closed with stitches (sutures). AFTER THE PROCEDURE  Your blood pressure, heart rate, breathing rate, and blood oxygen level will be monitored often until the medicines you were given have worn off.   This information is not intended to replace advice given to you by your health care provider. Make sure you discuss any questions you have with your health care provider.   Document Released: 03/01/2001 Document Revised: 08/14/2015 Document Reviewed: 07/06/2014 Elsevier Interactive Patient Education [employer].

## 2015-10-16 NOTE — Progress Notes (Signed)
GYNECOLOGY PROGRESS NOTE  Subjective:    Patient ID: Lauren Brooks, female    DOB: Nov 20, 1946, 69 y.o.   MRN: [id number]  HPI  Patient is a 69 y.o. postmenopausal P40 female who presents for follow up after visit to [facility] for right sided pelvic pain and nausea ~ 3 weeks ago.  Patient was told she had fibroids and a large right ovarian cyst.     Past Medical History  Diagnosis Date  . CVA (cerebral infarction)   . Fibroid     Family History  Problem Relation Age of Onset  . Breast cancer Cousin 37  . Hypertension Mother     Past Surgical History  Procedure Laterality Date  . Orif femur fracture      Social History   Social History  . Marital Status: Married    Spouse Name: N/A  . Number of Children: N/A  . Years of Education: N/A   Occupational History  . Not on file.   Social History Main Topics  . Smoking status: Never Smoker   . Smokeless tobacco: Not on file  . Alcohol Use: No  . Drug Use: No  . Sexual Activity: No   Other Topics Concern  . Not on file   Social History Narrative    Current Outpatient Prescriptions on File Prior to Visit  Medication Sig Dispense Refill  . atorvastatin (LIPITOR) 10 MG tablet Take 10 mg by mouth daily.    . ondansetron (ZOFRAN ODT) 8 MG disintegrating tablet Take 1 tablet (8 mg total) by mouth every 8 (eight) hours as needed for nausea or vomiting. 20 tablet 0  . warfarin (COUMADIN) 5 MG tablet Take 5 mg by mouth daily.    . meloxicam (MOBIC) 15 MG tablet Take 1 tablet (15 mg total) by mouth daily. (Patient not taking: Reported on 10/16/2015) 30 tablet 1   No current facility-administered medications on file prior to visit.    Allergies  Allergen Reactions  . Oxycodone Other (See Comments)    Altered mental status, inability to focus.      Review of Systems Pertinent items noted in HPI and remainder of comprehensive ROS otherwise negative.   Objective:   Blood pressure 125/79, pulse 92,  height 5\' 5"  (1.651 m), weight 219 lb 1.6 oz (99.383 kg). General appearance: alert and no distress, obese Abdomen: soft, non-tender; bowel sounds normal; no masses,  no organomegaly Pelvic: cervix normal in appearance, external genitalia normal, no bladder tenderness, no cervical motion tenderness, rectovaginal septum normal, urethra without abnormality or discharge, uterus normal size, shape, and consistency, vagina normal without discharge and left adnexa normal without masses or tenderness, right adnexal fullness noted, with mild tenderness Extremities: extremities normal, atraumatic, no cyanosis or edema Neurologic: Grossly normal   Imaging:   CT Abdomen/Pelvis 09/19/2015: IMPRESSION: 1. There is a cystic lesion probable containing fat and tiny calcification in right pelvis highly suspicious for right ovarian dermoid. No definite evidence of torsion. The lesion measures at least 10 x 9 cm. Correlation with GYN exam is recommended. 2. There is a right fundal uterine fibroid measures about 2.2 cm. 3. Moderate stool noted in transverse colon and descending colon. Abundant stool noted within rectum. 4. Normal appendix. No pericecal inflammation. 5. No small bowel obstruction. 6. No hydronephrosis or hydroureter.  Assessment:   Large right adnexal cyst (suspicious for dermoid) Uterine fibroid Pelvic pain Nausea H/o afib, on anticoagulant therapy (long term use, over 18 years)  Plan:   -  Discussion had with patient on need for surgical intervention to remove cyst, due to size and nature of the cyst. Discussed removal of bilateral ovaries as patient is postmenopausal, and will reduce risk of future ovarian pathology.  Patient notes understanding and is in agreement with plan.  Patient scheduled for laparoscopic BSO 10/28/2015. Will also order CA-125. The risks of surgery were discussed in detail with the patient including but not limited to: bleeding which may require transfusion or  reoperation; infection which may require prolonged hospitalization or re-hospitalization and antibiotic therapy; injury to bowel, bladder, ureters and major vessels or other surrounding organs; need for additional procedures including laparotomy; thromboembolic phenomenon, incisional problems and other postoperative or anesthesia complications.  Patient was told that the likelihood that her condition and symptoms will be treated effectively with this surgical management was very high; the postoperative expectations were also discussed in detail. The patient also understands the alternative treatment options which were discussed in full. All questions were answered.  She was told that she will be contacted by our surgical scheduler regarding the time and date of her surgery; routine preoperative instructions of having nothing to eat or drink after midnight on the day prior to surgery and also coming to the hospital 1.5 hours prior to her time of surgery were also emphasized.  She was told she may be called for a preoperative appointment about a week prior to surgery and will be given further preoperative instructions at that visit. Printed patient education handouts about the procedure were given to the patient to review at home. - Uterine fibroid small, 2 cm, asymptomatic.  Patient reports h/o small fibroid in the past.  - Pelvic pain and nausea likely due to size of cyst.  Was given medication at ER visit, notes that symptoms have improved.   - H/o afib, currently on anticoagulant therapy (Coumadin).  Patient is high risk of thromboembolic event, can undergo same-day procedure.  Will discontinue Coumadin 5-7 days prior to surgery, and can resume 24 hours later.  Will discuss with Dr. Frazier Richards if heparin bridging is necessary. Will check PT/INR today (at patient's request, as she is due for a check at the end of the week), and will repeat on day of surgery.    Rubie Maid, MD [facility]

## 2015-10-17 ENCOUNTER — Encounter: Payer: Self-pay | Admitting: Obstetrics and Gynecology

## 2015-10-17 DIAGNOSIS — J309 Allergic rhinitis, unspecified: Secondary | ICD-10-CM

## 2015-10-17 DIAGNOSIS — M1990 Unspecified osteoarthritis, unspecified site: Secondary | ICD-10-CM

## 2015-10-17 DIAGNOSIS — I4891 Unspecified atrial fibrillation: Secondary | ICD-10-CM

## 2015-10-17 HISTORY — DX: Unspecified osteoarthritis, unspecified site: M19.90

## 2015-10-17 HISTORY — DX: Unspecified atrial fibrillation: I48.91

## 2015-10-17 HISTORY — DX: Allergic rhinitis, unspecified: J30.9

## 2015-10-17 LAB — PROTHROMBIN TIME + INR
INR: 1.9 — ABNORMAL HIGH (ref 0.8–1.2)
Prothrombin Time: 19.6 s — ABNORMAL HIGH (ref 9.1–12.0)

## 2015-10-17 LAB — CA 125: CA 125: 15.6 U/mL (ref 0.0–38.1)

## 2015-10-17 NOTE — H&P (Signed)
Subjective:    Patient is a 69 y.o. [id number] female scheduled for Laparoscopic BSO. Indications for procedure are large right adnexal cyst, pelvic pain.   Pertinent Gynecological History: Menses: post-menopausal Bleeding: None Last mammogram: normal Date: last year  Last pap: normal Date: 5 years ago  Discussed Blood/Blood Products: yes   Menstrual History: OB History    Gravida Para Term Preterm AB TAB SAB Ectopic Multiple Living   2 2 2       2       Menarche age: 41  No LMP recorded. Patient is postmenopausal.    Past Medical History  Diagnosis Date  . CVA (cerebral infarction) 1998  . Fibroid   . Atrial fibrillation ([place])   . Allergic rhinitis 10/17/2015  . Arthritis 10/17/2015  . Osteopenia 08/08/2015    Overview:  per new bone density.   . Absolute anemia 02/28/2014  . Hypercholesterolemia 02/28/2014    Past Surgical History  Procedure Laterality Date  . Orif femur fracture      Family History  Problem Relation Age of Onset  . Breast cancer Cousin 3  . Hypertension Mother     Social History   Social History  . Marital Status: Married    Spouse Name: N/A  . Number of Children: N/A  . Years of Education: N/A   Social History Main Topics  . Smoking status: Never Smoker   . Smokeless tobacco: None  . Alcohol Use: No  . Drug Use: No  . Sexual Activity: No   Other Topics Concern  . None   Social History Narrative   Current Outpatient Prescriptions on File Prior to Visit  Medication Sig Dispense Refill  . atorvastatin (LIPITOR) 10 MG tablet Take 10 mg by mouth daily.    . ondansetron (ZOFRAN ODT) 8 MG disintegrating tablet Take 1 tablet (8 mg total) by mouth every 8 (eight) hours as needed for nausea or vomiting. 20 tablet 0  . warfarin (COUMADIN) 5 MG tablet Take 5 mg by mouth daily.    . meloxicam (MOBIC) 15 MG tablet Take 1 tablet (15 mg total) by mouth daily. (Patient not taking: Reported on 10/16/2015) 30 tablet 1   No current  facility-administered medications on file prior to visit.    Allergies  Allergen Reactions  . Oxycodone Other (See Comments)    Altered mental status, inability to focus.     Review of Systems Constitutional: No recent fever/chills/sweats Respiratory: No recent cough/bronchitis Cardiovascular: No chest pain Gastrointestinal: No recent vomiting/diarrhea.  Positive for nausea.  Genitourinary: No UTI symptoms. Positive for right sided mild pelvic pain.  Hematologic/lymphatic: Positive history of coagulopathy or recent blood thinner use    Objective:    BP 125/79 mmHg  Pulse 92  Ht 5\' 5"  (1.651 m)  Wt 219 lb 1.6 oz (99.383 kg)  BMI 36.46 kg/m2  General:   Normal, obese  Skin:   normal  HEENT:  Normal  Neck:  Supple without Adenopathy or Thyromegaly  Lungs:   Heart:              Breasts:   Abdomen:  Pelvis:  M/S   Extremeties:  Neuro:    clear to auscultation bilaterally   Normal without murmur   Not Examined   soft, non-tender; bowel sounds normal; no masses,  no organomegaly   cervix normal in appearance, external genitalia normal, no bladder tenderness, no cervical motion tenderness, rectovaginal septum normal, urethra without abnormality or discharge, uterus normal size, shape, and consistency,  vagina normal without discharge and left adnexa normal without masses or tenderness, right adnexal fullness noted, with mild tenderness  No CVAT  Warm/Dry   Normal           Imaging:   CT Abdomen/Pelvis 09/19/2015: IMPRESSION: 1. There is a cystic lesion probable containing fat and tiny calcification in right pelvis highly suspicious for right ovarian dermoid. No definite evidence of torsion. The lesion measures at least 10 x 9 cm. Correlation with GYN exam is recommended. 2. There is a right fundal uterine fibroid measures about 2.2 cm. 3. Moderate stool noted in transverse colon and descending colon. Abundant stool noted within rectum. 4. Normal appendix. No  pericecal inflammation. 5. No small bowel obstruction. 6. No hydronephrosis or hydroureter.  Assessment:    Large right adnexal cyst (suspicious for dermoid) Uterine fibroid Pelvic pain Nausea H/o afib, on anticoagulant therapy (long term use, over 18 years)  Plan:    - Discussion had with patient on need for surgical intervention to remove cyst, due to size and nature of the cyst. Discussed removal of bilateral ovaries as patient is postmenopausal, and will reduce risk of future ovarian pathology. Patient notes understanding and is in agreement with plan. Patient scheduled for laparoscopic BSO 10/28/2015. Will also order CA-125. The risks of surgery were discussed in detail with the patient including but not limited to: bleeding which may require transfusion or reoperation; infection which may require prolonged hospitalization or re-hospitalization and antibiotic therapy; injury to bowel, bladder, ureters and major vessels or other surrounding organs; need for additional procedures including laparotomy; thromboembolic phenomenon, incisional problems and other postoperative or anesthesia complications. Patient was told that the likelihood that her condition and symptoms will be treated effectively with this surgical management was very high; the postoperative expectations were also discussed in detail. The patient also understands the alternative treatment options which were discussed in full. All questions were answered. She was told that she will be contacted by our surgical scheduler regarding the time and date of her surgery; routine preoperative instructions of having nothing to eat or drink after midnight on the day prior to surgery and also coming to the hospital 1.5 hours prior to her time of surgery were also emphasized. She was told she may be called for a preoperative appointment about a week prior to surgery and will be given further preoperative instructions at that visit. Printed  patient education handouts about the procedure were given to the patient to review at home. - Uterine fibroid small, 2 cm, asymptomatic. Patient reports h/o small fibroid in the past.  - Pelvic pain and nausea likely due to size of cyst. Was given medication at ER visit, notes that symptoms have improved.  - H/o afib, currently on anticoagulant therapy (Coumadin). Patient is high risk of thromboembolic event, can undergo same-day procedure. Will discontinue Coumadin 5-7 days prior to surgery, and can resume 24 hours later. Will discuss with Dr. Frazier Richards if heparin bridging is necessary. Will check PT/INR today (at patient's request, as she is due for a check at the end of the week), and will repeat on day of surgery.    Rubie Maid, MD [facility]

## 2015-10-22 ENCOUNTER — Encounter
Admission: RE | Admit: 2015-10-22 | Discharge: 2015-10-22 | Disposition: A | Discharge status: Home / Self Care | Payer: Medicare PPO | Source: Ambulatory Visit | Attending: Obstetrics and Gynecology | Admitting: Obstetrics and Gynecology

## 2015-10-22 ENCOUNTER — Ambulatory Visit (INDEPENDENT_AMBULATORY_CARE_PROVIDER_SITE_OTHER): Payer: Medicare PPO | Admitting: Obstetrics and Gynecology

## 2015-10-22 VITALS — BP 169/92 | HR 96 | Ht 65.0 in | Wt 217.9 lb

## 2015-10-22 DIAGNOSIS — Z01818 Encounter for other preprocedural examination: Secondary | ICD-10-CM

## 2015-10-22 DIAGNOSIS — Z01812 Encounter for preprocedural laboratory examination: Secondary | ICD-10-CM | POA: Diagnosis present

## 2015-10-22 DIAGNOSIS — N83201 Unspecified ovarian cyst, right side: Secondary | ICD-10-CM

## 2015-10-22 DIAGNOSIS — Z01810 Encounter for preprocedural cardiovascular examination: Secondary | ICD-10-CM | POA: Diagnosis not present

## 2015-10-22 HISTORY — DX: Cerebral infarction, unspecified: I63.9

## 2015-10-22 LAB — CBC
HCT: 39.6 % (ref 35.0–47.0)
Hemoglobin: 12.6 g/dL (ref 12.0–16.0)
MCH: 28.2 pg (ref 26.0–34.0)
MCHC: 31.9 g/dL — ABNORMAL LOW (ref 32.0–36.0)
MCV: 88.3 fL (ref 80.0–100.0)
PLATELETS: 161 10*3/uL (ref 150–440)
RBC: 4.48 MIL/uL (ref 3.80–5.20)
RDW: 13.2 % (ref 11.5–14.5)
WBC: 4 10*3/uL (ref 3.6–11.0)

## 2015-10-22 LAB — TYPE AND SCREEN
ABO/RH(D): A POS
Antibody Screen: NEGATIVE

## 2015-10-22 LAB — BASIC METABOLIC PANEL
Anion gap: 5 (ref 5–15)
BUN: 13 mg/dL (ref 6–20)
CALCIUM: 9.6 mg/dL (ref 8.9–10.3)
CHLORIDE: 107 mmol/L (ref 101–111)
CO2: 28 mmol/L (ref 22–32)
CREATININE: 0.74 mg/dL (ref 0.44–1.00)
GFR calc Af Amer: >60 mL/min (ref >60)
GFR calc non Af Amer: >60 mL/min (ref >60)
Glucose, Bld: 107 mg/dL — ABNORMAL HIGH (ref 65–99)
Potassium: 3.8 mmol/L (ref 3.5–5.1)
SODIUM: 140 mmol/L (ref 135–145)

## 2015-10-22 LAB — PROTIME-INR
INR: 0.97
PROTHROMBIN TIME: 13.1 s (ref 11.4–15.0)

## 2015-10-22 LAB — ABO/RH: ABO/RH(D): A POS

## 2015-10-22 NOTE — Patient Instructions (Signed)
  Your procedure is scheduled on: October 28, 2015 (Monday) Report to Day Surgery.[facility]) To find out your arrival time please call [phone] between 1PM - 3PM on October 25, 2015(Friday).  Remember: Instructions that are not followed completely may result in serious medical risk, up to and including death, or upon the discretion of your surgeon and anesthesiologist your surgery may need to be rescheduled.    __x__ 1. Do not eat food or drink liquids after midnight. No gum chewing or hard candies.     ____ 2. No Alcohol for 24 hours before or after surgery.   ____ 3. Bring all medications with you on the day of surgery if instructed.    __x__ 4. Notify your doctor if there is any change in your medical condition     (cold, fever, infections).     Do not wear jewelry, make-up, hairpins, clips or nail polish.  Do not wear lotions, powders, or perfumes. You may wear deodorant.  Do not shave 48 hours prior to surgery. Men may shave face and neck.  Do not bring valuables to the hospital.    [facility] is not responsible for any belongings or valuables.               Contacts, dentures or bridgework may not be worn into surgery.  Leave your suitcase in the car. After surgery it may be brought to your room.  For patients admitted to the hospital, discharge time is determined by your                treatment team.   Patients discharged the day of surgery will not be allowed to drive home.   Please read over the following fact sheets that you were given:   Surgical Site Infection Prevention   ____ Take these medicines the morning of surgery with A SIP OF WATER:    1. Lipitor  2.   3.   4.  5.  6.  ____ Fleet Enema (as directed)   __x__ Use CHG Soap as directed  ____ Use inhalers on the day of surgery  ____ Stop metformin 2 days prior to surgery    ____ Take 1/2 of usual insulin dose the night before surgery and none on the morning of surgery.   __x__ Stop  Coumadin/Plavix/aspirin on (Patient stopped warfarin on November 11) per Dr. Marcelline Mates  ____ Stop Anti-inflammatories on    ____ Stop supplements until after surgery.    ____ Bring C-Pap to the hospital.

## 2015-10-22 NOTE — Progress Notes (Signed)
GYNECOLOGY PROGRESS NOTE  Subjective:    Patient ID: Lauren Brooks, female    DOB: 1946-04-12, 69 y.o.   MRN: [id number]  HPI  Patient is a 69 y.o. postmenopausal P34 female who presents for pre-operative exam for planned procedure of laparoscopic BSO for large right ovarian cyst in menopause.     I have reviewed the patient's medical, family, social, and family history in detail; there are no changes to the history as noted in the electronic medical record.  Medications and allergies are up to date.    Review of Systems A comprehensive review of systems was negative.   Objective:   Blood pressure 169/92, pulse 96, height 5\' 5"  (1.651 m), weight 217 lb 14.4 oz (98.839 kg). General appearance: alert and no distress, obese Abdomen: soft, non-tender; bowel sounds normal; no masses,  no organomegaly Pelvic: cervix normal in appearance, external genitalia normal, no bladder tenderness, no cervical motion tenderness, rectovaginal septum normal, urethra without abnormality or discharge, uterus normal size, shape, and consistency, vagina normal without discharge and left adnexa normal without masses or tenderness, right adnexal fullness noted, with mild tenderness Extremities: extremities normal, atraumatic, no cyanosis or edema Neurologic: Grossly normal   Imaging:   CT Abdomen/Pelvis 09/19/2015: IMPRESSION: 1. There is a cystic lesion probable containing fat and tiny calcification in right pelvis highly suspicious for right ovarian dermoid. No definite evidence of torsion. The lesion measures at least 10 x 9 cm. Correlation with GYN exam is recommended. 2. There is a right fundal uterine fibroid measures about 2.2 cm. 3. Moderate stool noted in transverse colon and descending colon. Abundant stool noted within rectum. 4. Normal appendix. No pericecal inflammation. 5. No small bowel obstruction. 6. No hydronephrosis or hydroureter.  Office Visit on 10/16/2015  Component  Date Value Ref Range Status  . CA 125 10/16/2015 15.6  0.0 - 38.1 U/mL Final   Roche ECLIA methodology  . INR 10/16/2015 1.9* 0.8 - 1.2 Final   Comment: Reference interval is for non-anticoagulated patients. Suggested INR therapeutic range for Vitamin K antagonist therapy:    Standard Dose (moderate intensity                   therapeutic range):       2.0 - 3.0    Higher intensity therapeutic range       2.5 - 3.5   . Prothrombin Time 10/16/2015 19.6* 9.1 - 12.0 sec Final     Assessment:   Large right adnexal cyst (suspicious for dermoid) Uterine fibroid Pelvic pain Nausea H/o afib, on anticoagulant therapy (long term use, over 18 years)  Plan:   - Discussion had with patient on need for surgical intervention to remove cyst, due to size and nature of the cyst. Discussed removal of bilateral ovaries as patient is postmenopausal, and will reduce risk of future ovarian pathology.  Patient notes understanding and is in agreement with plan.  Patient scheduled for laparoscopic BSO 10/28/2015. CA-125 negative. The risks of surgery were discussed in detail with the patient including but not limited to: bleeding which may require transfusion or reoperation; infection which may require prolonged hospitalization or re-hospitalization and antibiotic therapy; injury to bowel, bladder, ureters and major vessels or other surrounding organs; need for additional procedures including laparotomy; thromboembolic phenomenon, incisional problems and other postoperative or anesthesia complications.  Patient was told that the likelihood that her condition and symptoms will be treated effectively with this surgical management was very high; the postoperative expectations were also  discussed in detail. The patient also understands the alternative treatment options which were discussed in full. All questions were answered.  She was told that she will be contacted by our surgical scheduler regarding the time and date  of her surgery; routine preoperative instructions of having nothing to eat or drink after midnight on the day prior to surgery and also coming to the hospital 1.5 hours prior to her time of surgery were also emphasized.  She was told she may be called for a preoperative appointment about a week prior to surgery and will be given further preoperative instructions at that visit. Printed patient education handouts about the procedure were given to the patient to review at home.  Consent signed today.  - Uterine fibroid small, 2 cm, asymptomatic.  Patient reports h/o small fibroid in the past.  - Pelvic pain and nausea likely due to size of cyst.  Was given medication at ER visit, notes that symptoms have improved.   - H/o afib, currently on anticoagulant therapy (Coumadin).  Patient is high risk of thromboembolic event, can undergo same-day procedure.  Will discontinue Coumadin 5-7 days prior to surgery, and can resume 24 hours later.  Will discuss with Dr. Frazier Richards if heparin bridging is necessary.    Rubie Maid, MD [facility]

## 2015-10-24 ENCOUNTER — Encounter: Payer: Self-pay | Admitting: Obstetrics and Gynecology

## 2015-10-28 ENCOUNTER — Ambulatory Visit
Admission: RE | Admit: 2015-10-28 | Discharge: 2015-10-28 | Disposition: A | Discharge status: Home / Self Care | Payer: Medicare PPO | Source: Ambulatory Visit | Attending: Obstetrics and Gynecology | Admitting: Obstetrics and Gynecology

## 2015-10-28 ENCOUNTER — Encounter
Admission: RE | Disposition: A | Discharge status: Home / Self Care | Payer: Self-pay | Source: Ambulatory Visit | Attending: Obstetrics and Gynecology

## 2015-10-28 ENCOUNTER — Ambulatory Visit: Payer: Medicare PPO | Admitting: Anesthesiology

## 2015-10-28 DIAGNOSIS — Z803 Family history of malignant neoplasm of breast: Secondary | ICD-10-CM | POA: Diagnosis not present

## 2015-10-28 DIAGNOSIS — M8580 Other specified disorders of bone density and structure, unspecified site: Secondary | ICD-10-CM | POA: Insufficient documentation

## 2015-10-28 DIAGNOSIS — M1990 Unspecified osteoarthritis, unspecified site: Secondary | ICD-10-CM | POA: Insufficient documentation

## 2015-10-28 DIAGNOSIS — I4891 Unspecified atrial fibrillation: Secondary | ICD-10-CM | POA: Insufficient documentation

## 2015-10-28 DIAGNOSIS — R102 Pelvic and perineal pain: Secondary | ICD-10-CM | POA: Insufficient documentation

## 2015-10-28 DIAGNOSIS — J309 Allergic rhinitis, unspecified: Secondary | ICD-10-CM | POA: Insufficient documentation

## 2015-10-28 DIAGNOSIS — Z8249 Family history of ischemic heart disease and other diseases of the circulatory system: Secondary | ICD-10-CM | POA: Diagnosis not present

## 2015-10-28 DIAGNOSIS — D6489 Other specified anemias: Secondary | ICD-10-CM | POA: Diagnosis not present

## 2015-10-28 DIAGNOSIS — N83201 Unspecified ovarian cyst, right side: Secondary | ICD-10-CM | POA: Diagnosis not present

## 2015-10-28 DIAGNOSIS — Z8673 Personal history of transient ischemic attack (TIA), and cerebral infarction without residual deficits: Secondary | ICD-10-CM | POA: Insufficient documentation

## 2015-10-28 DIAGNOSIS — D3912 Neoplasm of uncertain behavior of left ovary: Secondary | ICD-10-CM | POA: Diagnosis not present

## 2015-10-28 DIAGNOSIS — E7800 Pure hypercholesterolemia, unspecified: Secondary | ICD-10-CM | POA: Diagnosis not present

## 2015-10-28 HISTORY — PX: LAPAROSCOPIC SALPINGO OOPHERECTOMY: SHX5927

## 2015-10-28 SURGERY — SALPINGO-OOPHORECTOMY, LAPAROSCOPIC
Anesthesia: General | Laterality: Bilateral

## 2015-10-28 MED ORDER — ONDANSETRON HCL 4 MG/2ML IJ SOLN
INTRAMUSCULAR | Status: DC | PRN
  Administered 2015-10-28: 4 mg via INTRAVENOUS

## 2015-10-28 MED ORDER — DEXAMETHASONE SODIUM PHOSPHATE 4 MG/ML IJ SOLN
INTRAMUSCULAR | Status: DC | PRN
  Administered 2015-10-28: 10 mg via INTRAVENOUS

## 2015-10-28 MED ORDER — FENTANYL CITRATE (PF) 100 MCG/2ML IJ SOLN
INTRAMUSCULAR | Status: DC | PRN
  Administered 2015-10-28 (×2): 50 ug via INTRAVENOUS

## 2015-10-28 MED ORDER — PROPOFOL 10 MG/ML IV BOLUS
INTRAVENOUS | Status: DC | PRN
  Administered 2015-10-28: 200 mg via INTRAVENOUS

## 2015-10-28 MED ORDER — TRAMADOL HCL 50 MG PO TABS
50.0000 mg | ORAL_TABLET | Freq: Once | ORAL | Status: AC
  Administered 2015-10-28: 50 mg via ORAL

## 2015-10-28 MED ORDER — ONDANSETRON HCL 4 MG/2ML IJ SOLN
INTRAMUSCULAR | Status: AC
  Filled 2015-10-28: qty 2

## 2015-10-28 MED ORDER — LACTATED RINGERS IV SOLN
INTRAVENOUS | Status: DC
  Administered 2015-10-28: 07:00:00 via INTRAVENOUS

## 2015-10-28 MED ORDER — PROMETHAZINE HCL 25 MG/ML IJ SOLN
INTRAMUSCULAR | Status: AC
  Administered 2015-10-28: 6.25 mg via INTRAVENOUS
  Filled 2015-10-28: qty 1

## 2015-10-28 MED ORDER — HYDROCODONE-ACETAMINOPHEN 5-325 MG PO TABS
ORAL_TABLET | ORAL | Status: AC
  Filled 2015-10-28: qty 1

## 2015-10-28 MED ORDER — NEOSTIGMINE METHYLSULFATE 10 MG/10ML IV SOLN
INTRAVENOUS | Status: DC | PRN
  Administered 2015-10-28: 3 mg via INTRAVENOUS

## 2015-10-28 MED ORDER — BUPIVACAINE HCL (PF) 0.5 % IJ SOLN
INTRAMUSCULAR | Status: AC
  Filled 2015-10-28: qty 30

## 2015-10-28 MED ORDER — FENTANYL CITRATE (PF) 100 MCG/2ML IJ SOLN
INTRAMUSCULAR | Status: AC
  Filled 2015-10-28: qty 2

## 2015-10-28 MED ORDER — TRAMADOL HCL 50 MG PO TABS: 50.0000 mg | ORAL_TABLET | Freq: Four times a day (QID) | ORAL | Status: DC | PRN

## 2015-10-28 MED ORDER — GLYCOPYRROLATE 0.2 MG/ML IJ SOLN
INTRAMUSCULAR | Status: DC | PRN
  Administered 2015-10-28: 0.4 mg via INTRAVENOUS

## 2015-10-28 MED ORDER — PHENYLEPHRINE HCL 10 MG/ML IJ SOLN
INTRAMUSCULAR | Status: DC | PRN
  Administered 2015-10-28 (×2): 200 ug via INTRAVENOUS

## 2015-10-28 MED ORDER — PROMETHAZINE HCL 25 MG/ML IJ SOLN
6.2500 mg | Freq: Four times a day (QID) | INTRAMUSCULAR | Status: AC | PRN
  Administered 2015-10-28: 6.25 mg via INTRAVENOUS

## 2015-10-28 MED ORDER — EPHEDRINE SULFATE 50 MG/ML IJ SOLN
INTRAMUSCULAR | Status: DC | PRN
  Administered 2015-10-28: 10 mg via INTRAVENOUS

## 2015-10-28 MED ORDER — MIDAZOLAM HCL 2 MG/2ML IJ SOLN
INTRAMUSCULAR | Status: DC | PRN
  Administered 2015-10-28: 2 mg via INTRAVENOUS

## 2015-10-28 MED ORDER — ROCURONIUM BROMIDE 100 MG/10ML IV SOLN
INTRAVENOUS | Status: DC | PRN
  Administered 2015-10-28: 10 mg via INTRAVENOUS
  Administered 2015-10-28: 30 mg via INTRAVENOUS

## 2015-10-28 MED ORDER — LIDOCAINE HCL (CARDIAC) 20 MG/ML IV SOLN
INTRAVENOUS | Status: DC | PRN
  Administered 2015-10-28: 100 mg via INTRAVENOUS

## 2015-10-28 MED ORDER — ONDANSETRON HCL 4 MG/2ML IJ SOLN
4.0000 mg | Freq: Once | INTRAMUSCULAR | Status: AC | PRN
  Administered 2015-10-28: 4 mg via INTRAVENOUS

## 2015-10-28 MED ORDER — FAMOTIDINE 20 MG PO TABS
20.0000 mg | ORAL_TABLET | Freq: Once | ORAL | Status: AC
  Administered 2015-10-28: 20 mg via ORAL

## 2015-10-28 MED ORDER — BUPIVACAINE HCL 0.5 % IJ SOLN
INTRAMUSCULAR | Status: DC | PRN
  Administered 2015-10-28: 40 mL

## 2015-10-28 MED ORDER — ENOXAPARIN SODIUM 40 MG/0.4ML ~~LOC~~ SOLN
40.0000 mg | SUBCUTANEOUS | Status: AC
  Administered 2015-10-28: 40 mg via SUBCUTANEOUS
  Filled 2015-10-28: qty 0.4

## 2015-10-28 MED ORDER — CITRIC ACID-SODIUM CITRATE 334-500 MG/5ML PO SOLN
30.0000 mL | ORAL | Status: DC
  Filled 2015-10-28: qty 30

## 2015-10-28 MED ORDER — HYDROCODONE-ACETAMINOPHEN 5-325 MG PO TABS: 1.0000 | ORAL_TABLET | Freq: Four times a day (QID) | ORAL | Status: DC | PRN

## 2015-10-28 MED ORDER — FAMOTIDINE 20 MG PO TABS
ORAL_TABLET | ORAL | Status: AC
  Administered 2015-10-28: 20 mg via ORAL
  Filled 2015-10-28: qty 1

## 2015-10-28 MED ORDER — ONDANSETRON HCL 4 MG PO TABS: 4.0000 mg | ORAL_TABLET | Freq: Three times a day (TID) | ORAL | Status: DC | PRN

## 2015-10-28 MED ORDER — FENTANYL CITRATE (PF) 100 MCG/2ML IJ SOLN
25.0000 ug | INTRAMUSCULAR | Status: DC | PRN
  Administered 2015-10-28 (×5): 25 ug via INTRAVENOUS

## 2015-10-28 MED ORDER — TRAMADOL HCL 50 MG PO TABS
ORAL_TABLET | ORAL | Status: AC
  Administered 2015-10-28: 50 mg via ORAL
  Filled 2015-10-28: qty 1

## 2015-10-28 MED ORDER — FENTANYL CITRATE (PF) 100 MCG/2ML IJ SOLN
INTRAMUSCULAR | Status: AC
  Administered 2015-10-28: 25 ug via INTRAVENOUS
  Filled 2015-10-28: qty 2

## 2015-10-28 SURGICAL SUPPLY — 41 items
ANCHOR TIS RET SYS 1550ML (BAG) ×3 IMPLANT
ANCHOR TIS RET SYS 235ML (MISCELLANEOUS) ×3 IMPLANT
BLADE SURG SZ11 CARB STEEL (BLADE) ×3 IMPLANT
CANISTER SUCT 1200ML W/VALVE (MISCELLANEOUS) ×3 IMPLANT
CATH ROBINSON RED A/P 16FR (CATHETERS) ×3 IMPLANT
CHLORAPREP W/TINT 26ML (MISCELLANEOUS) ×3 IMPLANT
CLOSURE WOUND 1/2 X4 (GAUZE/BANDAGES/DRESSINGS) ×1
CORD MONOPOLAR M/FML 12FT (MISCELLANEOUS) ×3 IMPLANT
GLOVE BIO SURGEON STRL SZ 6 (GLOVE) ×12 IMPLANT
GLOVE BIOGEL PI IND STRL 6.5 (GLOVE) ×1 IMPLANT
GLOVE BIOGEL PI INDICATOR 6.5 (GLOVE) ×2
GOWN STRL REUS W/ TWL LRG LVL3 (GOWN DISPOSABLE) ×2 IMPLANT
GOWN STRL REUS W/TWL LRG LVL3 (GOWN DISPOSABLE) ×4
IRRIGATION STRYKERFLOW (MISCELLANEOUS) IMPLANT
IRRIGATOR STRYKERFLOW (MISCELLANEOUS)
IV LACTATED RINGERS 1000ML (IV SOLUTION) ×3 IMPLANT
KIT RM TURNOVER CYSTO AR (KITS) ×3 IMPLANT
LABEL OR SOLS (LABEL) ×3 IMPLANT
LIQUID BAND (GAUZE/BANDAGES/DRESSINGS) ×3 IMPLANT
NS IRRIG 1000ML POUR BTL (IV SOLUTION) IMPLANT
NS IRRIG 500ML POUR BTL (IV SOLUTION) ×3 IMPLANT
PACK GYN LAPAROSCOPIC (MISCELLANEOUS) ×3 IMPLANT
PAD OB MATERNITY 4.3X12.25 (PERSONAL CARE ITEMS) ×3 IMPLANT
PAD PREP 24X41 OB/GYN DISP (PERSONAL CARE ITEMS) ×3 IMPLANT
POUCH ENDO CATCH 10MM SPEC (MISCELLANEOUS) IMPLANT
SCISSORS METZENBAUM CVD 33 (INSTRUMENTS) IMPLANT
SHEARS HARMONIC ACE PLUS 36CM (ENDOMECHANICALS) ×3 IMPLANT
SLEEVE ENDOPATH XCEL 5M (ENDOMECHANICALS) ×3 IMPLANT
SPONGE XRAY 4X4 16PLY STRL (MISCELLANEOUS) ×3 IMPLANT
STRIP CLOSURE SKIN 1/2X4 (GAUZE/BANDAGES/DRESSINGS) ×2 IMPLANT
SUT MNCRL 3 0 RB1 (SUTURE) ×1 IMPLANT
SUT MONOCRYL 3 0 RB1 (SUTURE) ×2
SUT VIC AB 3-0 SH 27 (SUTURE) ×2
SUT VIC AB 3-0 SH 27X BRD (SUTURE) ×1 IMPLANT
SUT VICRYL 0 AB UR-6 (SUTURE) ×3 IMPLANT
SUTURE MONO/POLY 2.0 8IN (SUTURE) ×3 IMPLANT
SUTURE MONOCRYL 2-0 UR6 VIOLOT (SUTURE) ×3 IMPLANT
TROCAR ENDO BLADELESS 11MM (ENDOMECHANICALS) ×3 IMPLANT
TROCAR XCEL NON-BLD 5MMX100MML (ENDOMECHANICALS) ×3 IMPLANT
TROCAR XCEL UNIV SLVE 11M 100M (ENDOMECHANICALS) ×3 IMPLANT
TUBING INSUFFLATOR HI FLOW (MISCELLANEOUS) ×3 IMPLANT

## 2015-10-28 NOTE — H&P (Signed)
UPDATE TO PREVIOUS HISTORY AND PHYSICAL  The patient has been seen and examined.  H&P is up to date, no changes noted.  Patient is a 69 y.o. [id number] female scheduled for Laparoscopic BSO. Indications for procedure are large right adnexal cyst, pelvic pain. All questions answered. Previously counseled on risks, benefits of procedure.  Patient can proceed to the OR for scheduled procedure.   Rubie Maid, MD 10/28/2015 7:14 AM

## 2015-10-28 NOTE — Anesthesia Procedure Notes (Signed)
Procedure Name: Intubation Date/Time: 10/28/2015 7:59 AM Performed by: Silvana Newness Pre-anesthesia Checklist: Patient identified, Emergency Drugs available, Suction available, Patient being monitored and Timeout performed Patient Re-evaluated:Patient Re-evaluated prior to inductionOxygen Delivery Method: Circle system utilized Preoxygenation: Pre-oxygenation with 100% oxygen Intubation Type: IV induction Ventilation: Mask ventilation without difficulty Laryngoscope Size: Mac and 3 Grade View: Grade I Tube type: Oral Tube size: 7.0 mm Number of attempts: 1 Airway Equipment and Method: Rigid stylet Placement Confirmation: ETT inserted through vocal cords under direct vision,  positive ETCO2 and breath sounds checked- equal and bilateral Secured at: 20 cm Tube secured with: Tape Dental Injury: Teeth and Oropharynx as per pre-operative assessment

## 2015-10-28 NOTE — Op Note (Signed)
Procedure(s): LAPAROSCOPIC BILATERAL SALPINGO OOPHORECTOMY Procedure Note  Lauren Brooks female 69 y.o. 10/28/2015  Indications: 69 y.o. G2P2002 with large right adnexal cyst, pelvic pain  Pre-operative Diagnosis: Large right adnexal cyst, pelvic pain  Post-operative Diagnosis: Same  Surgeon: Rubie Maid, MD  Assistants: Malachi Paradise, MD  Anesthesia: General endotracheal anesthesia  ASA Class: II   Procedure Details: The patient was seen in the Holding Room. The risks, benefits, complications, treatment options, and expected outcomes were discussed with the patient.  The patient concurred with the proposed plan, giving informed consent.  The site of surgery properly noted/marked. The patient was taken to the Operating Room, identified as Lauren Brooks and the procedure verified as Laparoscopic Bilateral Salpingo-oophorectomy. A Time Out was held and the above information confirmed.  She was then placed under general anesthesia without difficulty. She was placed in the dorsal lithotomy position, and was prepped and draped in a sterile manner.  A straight catheterization was performed. A sterile speculum was inserted into the vagina and the cervix was grasped at the anterior lip using a single-toothed tenaculum.  The uterus was sounded to 8.5 cm, and a Hulka clamp was placed for uterine manipulation.  The speculum and tenaculum were then removed. After an adequate timeout was performed, attention was turned to the abdomen where an umbilical incision was made with the scalpel.  The Optiview 5-mm trocar and sleeve were then advanced without difficulty with the laparoscope under direct visualization into the abdomen.  The abdomen was then insufflated with carbon dioxide gas and adequate pneumoperitoneum was obtained. A 5-mm left lower quadrant port and an 11-mm right lower quadrant port were then placed under direct visualization.  A survey of the patient's pelvis and  abdomen revealed the findings as above.  The ureters were identified bilaterally with peristalsis.  On the right side, the infundibulopelvic ligament was clamped and transected with the Harmonic device. This procedure was then repeated on the left side.  An Endocatch bag was then inserted into the 11 mm trochar and the left ovary and tube were removed.  The 11 mm trochar was then removed, and a second larger Endocatch bag was inserted to remove the right adnexa.  The skin and fascial incision were extended to be able to remove the specimen. The fascia of the incision was then grasped with Kocher clamps and approximated using 0-Vicryl in a running fashion.  The subcutaneous fat layer was approximated using 3-0 Vicryl in a running fashion.   The pneumoperitoneum was then re-established, and the laparoscope was then introduced once again into the abdominal cavity.  A final survey was performed, where good hemostasis was noted on the right side.  There was slight oozing noted from the left surgical site, which was treated using the Kleppingers.  Good hemostasis was then achieved.  All trocars were removed under direct visualization, and the abdomen which was desufflated.    All skin incisions were closed with 4-0 Monocryl subcuticular stitches. The patient tolerated the procedures well.  All instruments, needles, and sponge counts were correct x 2. The patient was taken to the recovery room awake, extubated and in stable condition.   Findings: The uterus was sounded to 8.5 cm.  Anterior uterine fibroid present, ~ 3-4 cm.  Fallopian  tubes bilaterally and left ovary appeared normal.  Right ovary with large adnexal mass, cystic  Estimated Blood Loss:  Minimal       Drains: straight catheterization prior to procedure with ~ 350 ml of  clear urine         Total IV Fluids:  750 ml  Specimens: Bilateral fallopian tubes and ovaries, Disposition:  Sent to Pathology         Implants: None          Complications:  None; patient tolerated the procedure well.         Disposition: PACU - hemodynamically stable.         Condition: stable   Rubie Maid, MD [facility]

## 2015-10-28 NOTE — Anesthesia Postprocedure Evaluation (Signed)
Anesthesia Post Note  Patient: Lauren Brooks  Procedure(s) Performed: Procedure(s) (LRB): LAPAROSCOPIC SALPINGO OOPHORECTOMY (Bilateral)  Anesthesia Post Evaluation  Last Vitals:  Filed Vitals:   10/28/15 1001 10/28/15 1008  BP:  108/66  Pulse: 88 78  Temp:    Resp: 21 15    Last Pain:  Filed Vitals:   10/28/15 1022  PainSc: 8                  VAN STAVEREN,Kimoni Pagliarulo

## 2015-10-28 NOTE — Progress Notes (Addendum)
Entered in error

## 2015-10-28 NOTE — Transfer of Care (Signed)
Immediate Anesthesia Transfer of Care Note  Patient: Lauren Brooks  Procedure(s) Performed: Procedure(s): LAPAROSCOPIC SALPINGO OOPHORECTOMY (Bilateral)  Patient Location: PACU  Anesthesia Type:General  Level of Consciousness: awake, alert , oriented and patient cooperative  Airway & Oxygen Therapy: Patient Spontanous Breathing and Patient connected to face mask oxygen  Post-op Assessment: Report given to RN, Post -op Vital signs reviewed and stable and Patient moving all extremities X 4  Post vital signs: Reviewed and stable  Last Vitals:  Filed Vitals:   10/28/15 0610 10/28/15 0715  BP: 126/77   Pulse: 77   Temp: 36.9 C 37.1 C  Resp: 16     Complications: No apparent anesthesia complications

## 2015-10-28 NOTE — Discharge Instructions (Signed)
General Gynecological Post-Operative Instructions You may expect to feel dizzy, weak, and drowsy for as long as 24 hours after receiving the medicine that made you sleep (anesthetic).  Do not drive a car, ride a bicycle, participate in physical activities, or take public transportation until you are done taking narcotic pain medicines or as directed by your doctor.  Do not drink alcohol or take tranquilizers.  Do not take medicine that has not been prescribed by your doctor.  Do not sign important papers or make important decisions while on narcotic pain medicines.  Have a responsible person with you.  CARE OF INCISION  Keep incision clean and dry. Take showers instead of baths until your doctor gives you permission to take baths.  Avoid heavy lifting (more than 10 pounds/4.5 kilograms), pushing, or pulling.  Avoid activities that may risk injury to your surgical site.  No sexual intercourse or placement of anything in the vagina for 2 weeks or as instructed by your doctor. Remove bandages after 1 week. Only take prescription or over-the-counter medicines  for pain, discomfort, or fever as directed by your doctor. Do not take aspirin. It can make you bleed. Take medicines (antibiotics) that kill germs if they are prescribed for you.  Call the office or go to the Emergency Room if:  You feel sick to your stomach (nauseous).  You start to throw up (vomit).  You have trouble eating or drinking.  You have an oral temperature above 101.  You have constipation that is not helped by adjusting diet or increasing fluid intake. Pain medicines are a common cause of constipation.  You have any other concerns. SEEK IMMEDIATE MEDICAL CARE IF:  You have persistent dizziness.  You have difficulty breathing or a congested sounding (croupy) cough.  You have an oral temperature above 102.5, not controlled by medicine.  There is increasing pain or tenderness near or in the surgical site.

## 2015-10-28 NOTE — Anesthesia Preprocedure Evaluation (Signed)
Anesthesia Evaluation  Patient identified by MRN, date of birth, ID band Patient awake    Reviewed: Allergy & Precautions, NPO status , Patient's Chart, lab work & pertinent test results  Airway Mallampati: II       Dental no notable dental hx.    Pulmonary neg pulmonary ROS,    Pulmonary exam normal        Cardiovascular negative cardio ROS   Rhythm:Regular Rate:Normal     Neuro/Psych    GI/Hepatic negative GI ROS, Neg liver ROS,   Endo/Other  negative endocrine ROS  Renal/GU negative Renal ROS     Musculoskeletal   Abdominal Normal abdominal exam  (+)   Peds negative pediatric ROS (+)  Hematology negative hematology ROS (+)   Anesthesia Other Findings   Reproductive/Obstetrics                             Anesthesia Physical Anesthesia Plan  ASA: II  Anesthesia Plan: General   Post-op Pain Management:    Induction: Intravenous  Airway Management Planned: Oral ETT  Additional Equipment:   Intra-op Plan:   Post-operative Plan: Extubation in OR  Informed Consent: I have reviewed the patients History and Physical, chart, labs and discussed the procedure including the risks, benefits and alternatives for the proposed anesthesia with the patient or authorized representative who has indicated his/her understanding and acceptance.     Plan Discussed with: CRNA  Anesthesia Plan Comments:         Anesthesia Quick Evaluation

## 2015-10-29 LAB — SURGICAL PATHOLOGY

## 2015-11-13 ENCOUNTER — Ambulatory Visit (INDEPENDENT_AMBULATORY_CARE_PROVIDER_SITE_OTHER): Payer: Medicare PPO | Admitting: Obstetrics and Gynecology

## 2015-11-13 ENCOUNTER — Encounter: Payer: Self-pay | Admitting: Obstetrics and Gynecology

## 2015-11-13 VITALS — BP 117/77 | HR 80 | Ht 65.0 in | Wt 217.4 lb

## 2015-11-13 DIAGNOSIS — Z90722 Acquired absence of ovaries, bilateral: Secondary | ICD-10-CM

## 2015-11-13 DIAGNOSIS — Z9889 Other specified postprocedural states: Secondary | ICD-10-CM

## 2015-11-13 DIAGNOSIS — D369 Benign neoplasm, unspecified site: Secondary | ICD-10-CM

## 2015-11-13 MED ORDER — LIDOCAINE HCL 2 % EX GEL: 1.0000 "application " | CUTANEOUS | Status: DC | PRN

## 2015-11-13 NOTE — Progress Notes (Signed)
GYN POST-OPERATIVE PROGRESS NOTE  Subjective:     Lauren Brooks is a 69 y.o. G26P2002 female who presents to the clinic 2 weeks status post laparoscopic bilateral oophorectomy for adnexal mass (right sided). Eating a regular diet without difficulty. Bowel movements are normal. Pain is controlled with current analgesics. Medications being used: prescription NSAID's including Tramadol.  Still notes soreness of left mini-laparotomy site.  The following portions of the patient's history were reviewed and updated as appropriate: allergies, current medications, past family history, past medical history, past social history, past surgical history and problem list.  Review of Systems Pertinent items noted in HPI and remainder of comprehensive ROS otherwise negative.    Objective:    BP 117/77 mmHg  Pulse 80  Ht 5\' 5"  (1.651 m)  Wt 217 lb 6.4 oz (98.612 kg)  BMI 36.18 kg/m2 General:  alert and no distress  Abdomen: soft, bowel sounds active, non-tender.   Incision:   healing well, no drainage, no erythema, no hernia,no swelling, no dehiscence, incision well approximated.  Possible seroma under left min-laparotomy site.        Pathology 10/28/15:  A. RIGHT OVARY AND FALLOPIAN TUBE; RIGHT SALPINGO-OOPHORECTOMY:  - MATURE TERATOMA.  - FALLOPIAN TUBE WITHOUT PATHOLOGIC CHANGE.   B. LEFT OVARY AND FALLOPIAN TUBE; LEFT SALPINGO-OOPHORECTOMY:  - MATURE TERATOMA.  - FALLOPIAN TUBE WITHOUT PATHOLOGIC CHANGE.  Assessment:    Doing well postoperatively.  S/p laparoscopic BSO. S/p large left adnexal cyst  Plan:   1. Continue any current medications.  Will prescribe Lidocaine gel to apply prn to mini-lap site. 2. Wound care discussed.   3. Activity restrictions: no lifting more than 15 pounds 4. Anticipated return to work: not applicable. 5. Follow up: as needed,  or in 1 year for annual follow up.    Rubie Maid, MD [facility]

## 2016-07-15 ENCOUNTER — Other Ambulatory Visit: Payer: Self-pay | Admitting: Nurse Practitioner

## 2016-07-15 DIAGNOSIS — Z1231 Encounter for screening mammogram for malignant neoplasm of breast: Secondary | ICD-10-CM

## 2016-09-07 ENCOUNTER — Ambulatory Visit: Payer: Medicare PPO

## 2016-09-08 ENCOUNTER — Ambulatory Visit
Admission: RE | Admit: 2016-09-08 | Discharge: 2016-09-08 | Disposition: A | Discharge status: Home / Self Care | Payer: Medicare Other | Source: Ambulatory Visit | Attending: Nurse Practitioner | Admitting: Nurse Practitioner

## 2016-09-08 DIAGNOSIS — Z1231 Encounter for screening mammogram for malignant neoplasm of breast: Secondary | ICD-10-CM | POA: Diagnosis not present

## 2016-10-14 IMAGING — MG MM DIGITAL SCREENING BILAT W/ CAD
4 series · 4 of 4 positions shown · non-contrast
Comparison: Previous exam(s).

CLINICAL DATA: Screening.

EXAM:
DIGITAL SCREENING BILATERAL MAMMOGRAM WITH CAD

[L MLO]
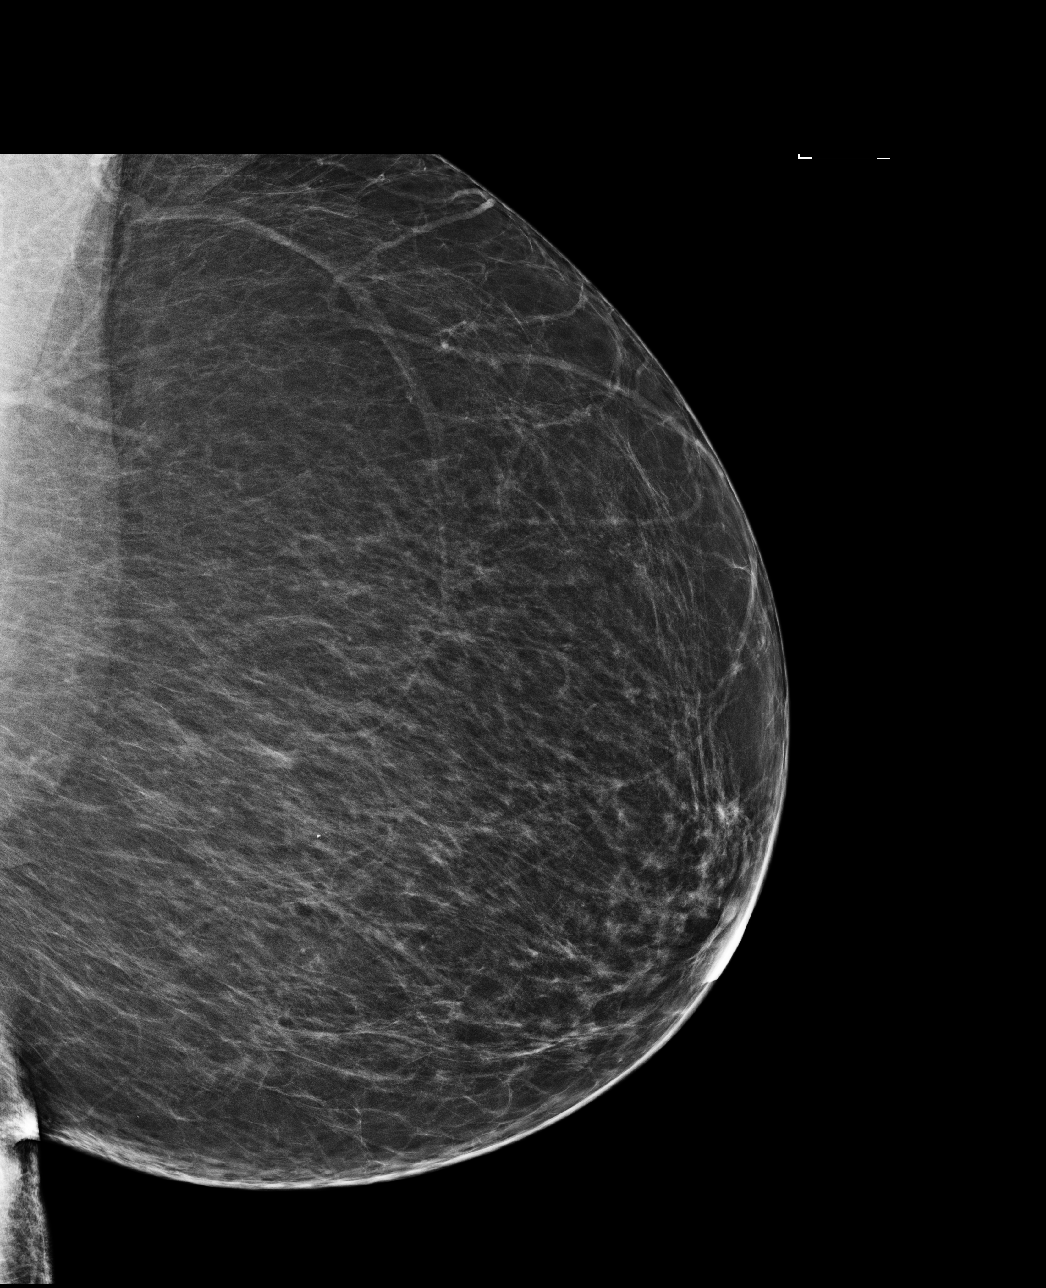

[L CC]
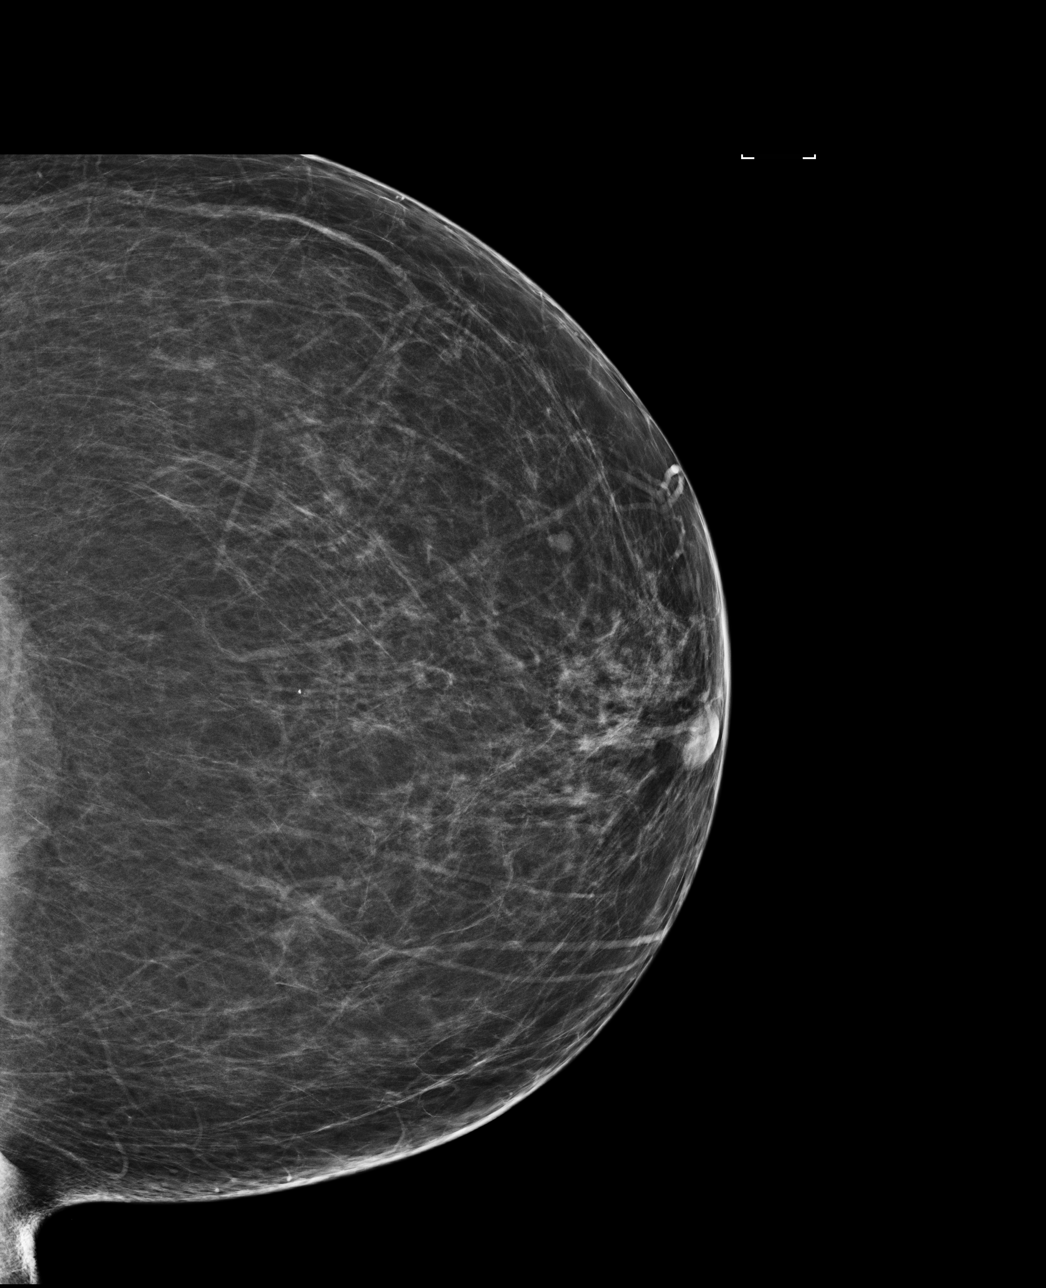

[R MLO]
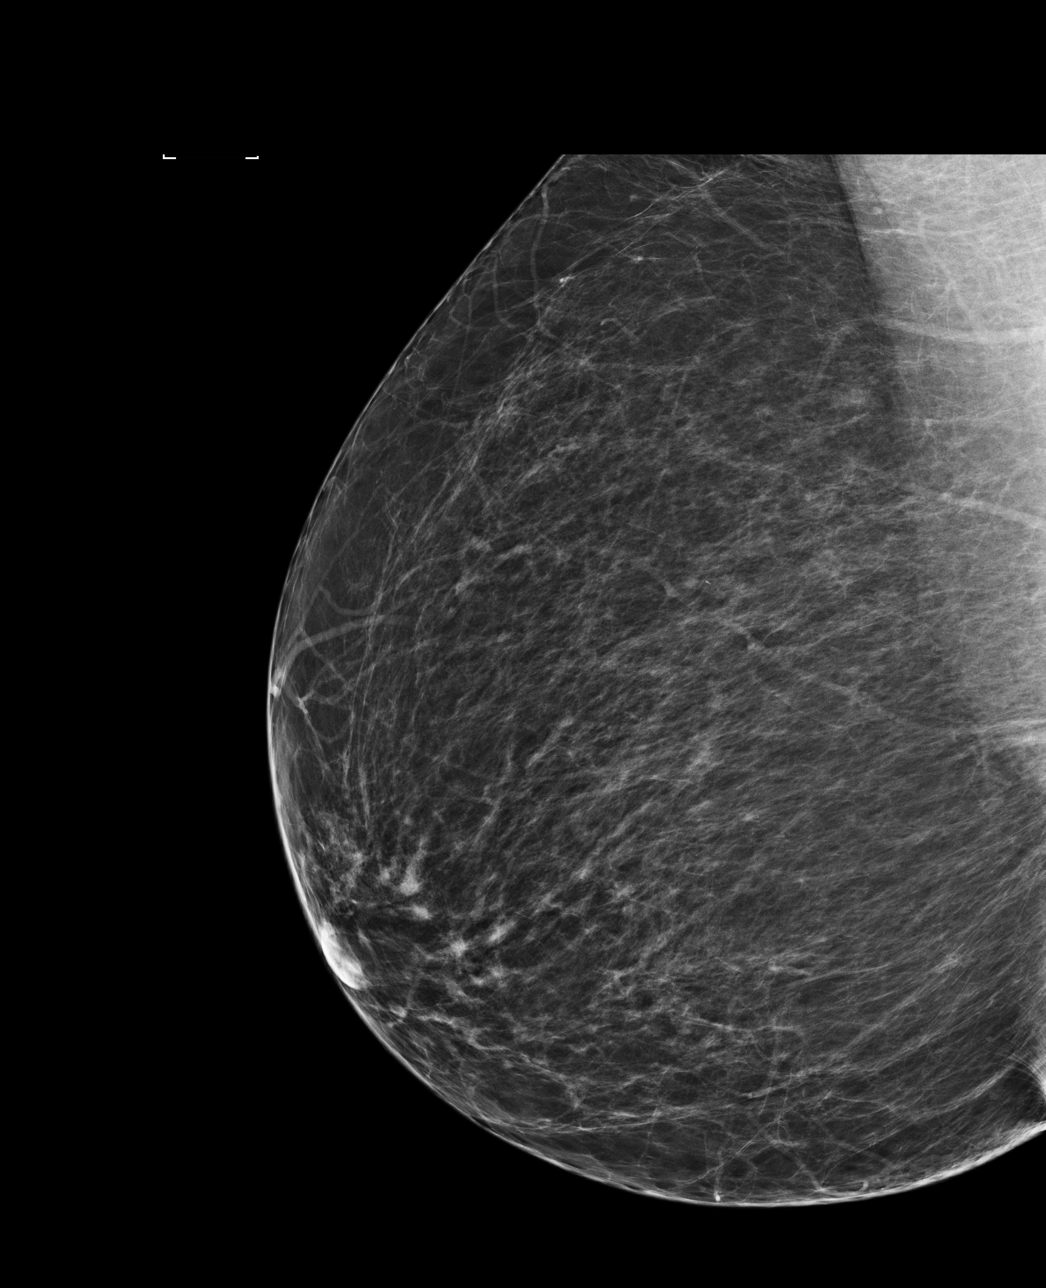

[R CC]
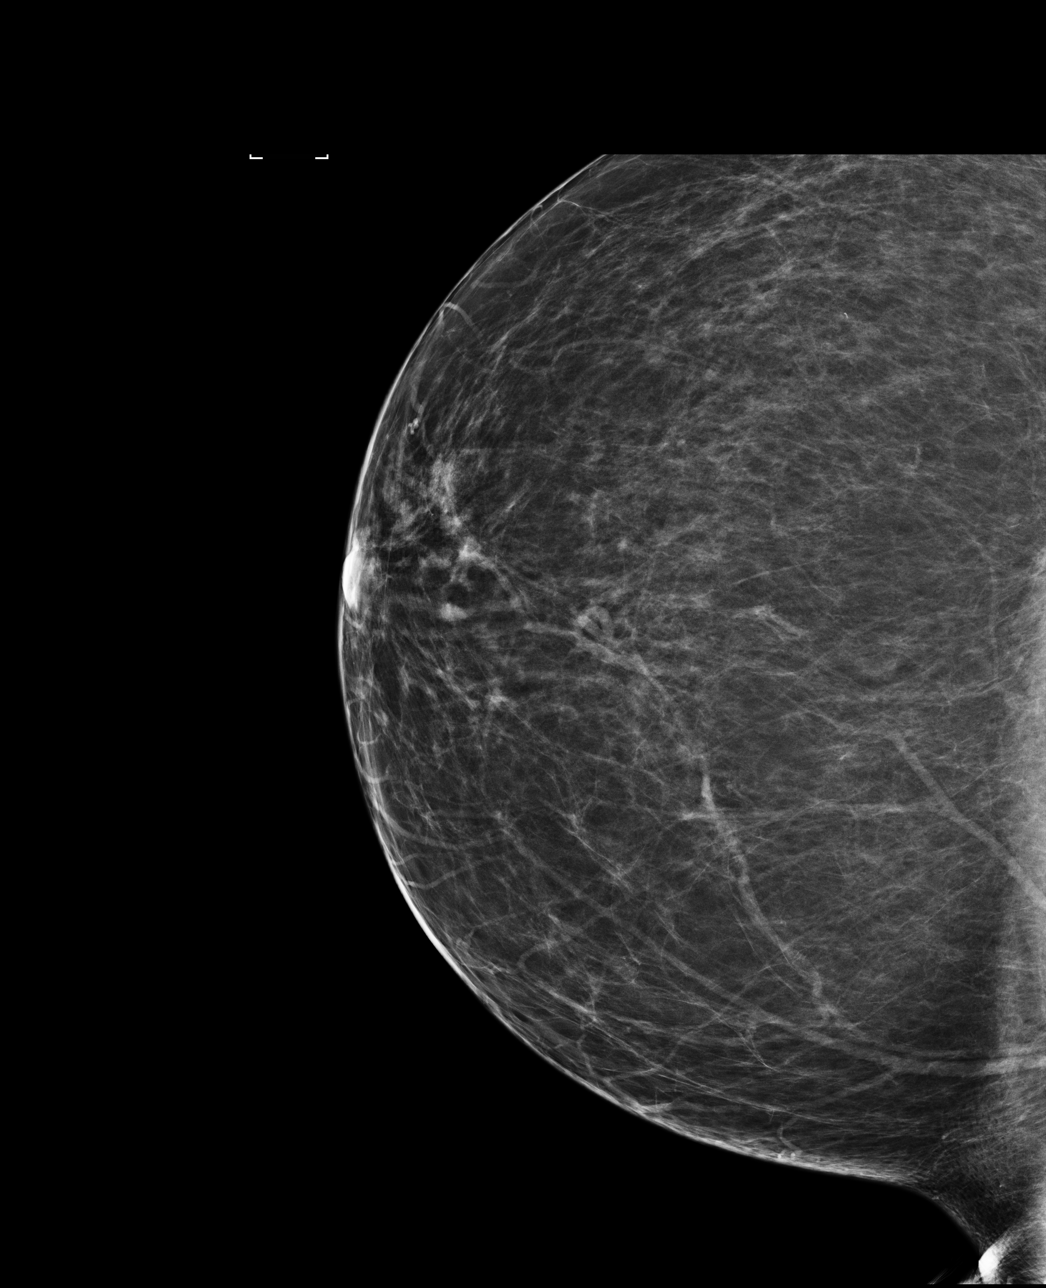

[4 of 4 positions shown; findings below may reference images not displayed]

ACR Breast Density Category b: There are scattered areas of
fibroglandular density.
FINDINGS: There are no findings suspicious for malignancy. Images were
processed with CAD.
IMPRESSION: No mammographic evidence of malignancy. A result letter of this
screening mammogram will be mailed directly to the patient.

RECOMMENDATION:
Screening mammogram in one year. (Code:AS-G-LCT)

BI-RADS CATEGORY  1: Negative.

## 2016-11-17 ENCOUNTER — Encounter: Payer: Medicare PPO | Admitting: Obstetrics and Gynecology

## 2016-12-14 NOTE — Progress Notes (Deleted)
ANNUAL PREVENTATIVE CARE GYNECOLOGY  ENCOUNTER NOTE  Subjective:       Lauren Brooks is a 71 y.o. G29P2002 female here for a routine annual gynecologic exam. The patient {is/is not/has never been:13135} sexually active. The patient {is/is not:13135} taking hormone replacement therapy. {post-men bleed:13152::"Patient denies post-menopausal vaginal bleeding."} The patient wears seatbelts: {yes/no:311178}. The patient participates in regular exercise: {yes/no/not asked:9010}. Has the patient ever been transfused or tattooed?: {yes/no/not asked:9010}. The patient reports that there {is/is not:9024} domestic violence in her life. Current complaints: 1.  ***    Gynecologic History No LMP recorded. Patient is postmenopausal. Contraception: post menopausal status Last Pap: ***. Results were: {norm/abn:16337} Last mammogram: ***. Results were: {norm/abn:16337} Last Colonoscopy:  Last Dexa Scan:    Obstetric History OB History  Gravida Para Term Preterm AB Living  2 2 2     2   SAB TAB Ectopic Multiple Live Births               # Outcome Date GA Lbr Len/2nd Weight Sex Delivery Anes PTL Lv  2 Term           1 Term               Past Medical History:  Diagnosis Date  . Absolute anemia 02/28/2014  . Allergic rhinitis 10/17/2015  . Arthritis 10/17/2015  . Atrial fibrillation ([place]) 10/17/2015  . Cerebral infarction ([place]) 02/28/2014  . CVA (cerebral infarction)   . Fibroid   . Hypercholesterolemia 02/28/2014  . Osteopenia 08/08/2015   Overview:  per new bone density.   . Stroke [facility])     Family History  Problem Relation Age of Onset  . Hypertension Mother   . Breast cancer Cousin 30    Past Surgical History:  Procedure Laterality Date  . BACK SURGERY     Bulging Disc, Dr. Mauri Pole  . EYE SURGERY Left    Cataract Extraction  . LAPAROSCOPIC SALPINGO OOPHERECTOMY Bilateral 10/28/2015   Procedure: LAPAROSCOPIC SALPINGO OOPHORECTOMY;  Surgeon: Rubie Maid, MD;  Location:  [facility] ORS;  Service: Gynecology;  Laterality: Bilateral;  . OOPHORECTOMY Bilateral 2016  . ORIF FEMUR FRACTURE Left 03/21/15   Dr. Roland Rack, [facility]    Social History   Social History  . Marital status: Divorced    Spouse name: N/A  . Number of children: N/A  . Years of education: N/A   Occupational History  . Not on file.   Social History Main Topics  . Smoking status: Never Smoker  . Smokeless tobacco: Never Used  . Alcohol use No  . Drug use: No  . Sexual activity: No   Other Topics Concern  . Not on file   Social History Narrative  . No narrative on file    Current Outpatient Prescriptions on File Prior to Visit  Medication Sig Dispense Refill  . atorvastatin (LIPITOR) 10 MG tablet Take 10 mg by mouth daily.    . Calcium Carbonate-Vit D-Min (CALCIUM 1200 PO) Take 1 tablet by mouth daily.    . cholecalciferol (VITAMIN D) 1000 UNITS tablet Take 1,000 Units by mouth daily.    . fluticasone (FLONASE) 50 MCG/ACT nasal spray     . HYDROcodone-acetaminophen (NORCO/VICODIN) 5-325 MG tablet Take 1 tablet by mouth every 6 (six) hours as needed for severe pain. 30 tablet 0  . lidocaine (XYLOCAINE) 2 % jelly Apply 1 application topically as needed. 30 mL 2  . meloxicam (MOBIC) 15 MG tablet TK 1 T PO D  1  .  Multiple Vitamin (MULTI VITAMIN DAILY PO) Take 1 tablet by mouth daily.    . ondansetron (ZOFRAN ODT) 8 MG disintegrating tablet Take 1 tablet (8 mg total) by mouth every 8 (eight) hours as needed for nausea or vomiting. 20 tablet 0  . ondansetron (ZOFRAN) 4 MG tablet Take 1 tablet (4 mg total) by mouth every 8 (eight) hours as needed for nausea or vomiting. 20 tablet 0  . traMADol (ULTRAM) 50 MG tablet Take 1 tablet (50 mg total) by mouth every 6 (six) hours as needed for moderate pain. May take up 1- 2 tablets every 6 hours prn pain 60 tablet 0  . warfarin (COUMADIN) 1 MG tablet     . warfarin (COUMADIN) 5 MG tablet Take 5 mg by mouth daily at 6 PM. 5 mg Saturday and Sunday, 6 mg  Monday - Friday    . warfarin (COUMADIN) 6 MG tablet      No current facility-administered medications on file prior to visit.     Allergies  Allergen Reactions  . Oxycodone Other (See Comments)    Altered mental status, inability to focus.       Review of Systems ROS Review of Systems - General ROS: negative for - chills, fatigue, fever, hot flashes, night sweats, weight gain or weight loss Psychological ROS: negative for - anxiety, decreased libido, depression, mood swings, physical abuse or sexual abuse Ophthalmic ROS: negative for - blurry vision, eye pain or loss of vision ENT ROS: negative for - headaches, hearing change, visual changes or vocal changes Allergy and Immunology ROS: negative for - hives, itchy/watery eyes or seasonal allergies Hematological and Lymphatic ROS: negative for - bleeding problems, bruising, swollen lymph nodes or weight loss Endocrine ROS: negative for - galactorrhea, hair pattern changes, hot flashes, malaise/lethargy, mood swings, palpitations, polydipsia/polyuria, skin changes, temperature intolerance or unexpected weight changes Breast ROS: negative for - new or changing breast lumps or nipple discharge Respiratory ROS: negative for - cough or shortness of breath Cardiovascular ROS: negative for - chest pain, irregular heartbeat, palpitations or shortness of breath Gastrointestinal ROS: no abdominal pain, change in bowel habits, or black or bloody stools Genito-Urinary ROS: no dysuria, trouble voiding, or hematuria Musculoskeletal ROS: negative for - joint pain or joint stiffness Neurological ROS: negative for - bowel and bladder control changes Dermatological ROS: negative for rash and skin lesion changes   Objective:   There were no vitals taken for this visit. CONSTITUTIONAL: Well-developed, well-nourished female in no acute distress.  PSYCHIATRIC: Normal mood and affect. Normal behavior. Normal judgment and thought content. Slope:  Alert and oriented to person, place, and time. Normal muscle tone coordination. No cranial nerve deficit noted. HENT:  Normocephalic, atraumatic, External right and left ear normal. Oropharynx is clear and moist EYES: Conjunctivae and EOM are normal. Pupils are equal, round, and reactive to light. No scleral icterus.  NECK: Normal range of motion, supple, no masses.  Normal thyroid.  SKIN: Skin is warm and dry. No rash noted. Not diaphoretic. No erythema. No pallor. CARDIOVASCULAR: Normal heart rate noted, regular rhythm, no murmur. RESPIRATORY: Clear to auscultation bilaterally. Effort and breath sounds normal, no problems with respiration noted. BREASTS: Symmetric in size. No masses, skin changes, nipple drainage, or lymphadenopathy. ABDOMEN: Soft, normal bowel sounds, no distention noted.  No tenderness, rebound or guarding.  BLADDER: Normal PELVIC:  Bladder {bladder exam:311640}  Urethra: {urethra:311719::"not indicated"}  Vulva: {vulva:311722::"not indicated"}  Vagina: {vagina exam:311643::"not indicated"}  Cervix: {cervix:311644::"not indicated"}  Uterus: {uterus:311718::"not indicated"}  Adnexa: [id number] indicated"}  RV: {Blank multiple:19196::"External Exam NormaI","No Rectal Masses","Normal Sphincter tone"}  MUSCULOSKELETAL: Normal range of motion. No tenderness.  No cyanosis, clubbing, or edema.  2+ distal pulses. LYMPHATIC: No Axillary, Supraclavicular, or Inguinal Adenopathy.    Assessment:   Annual gynecologic examination 71 y.o. Contraception: {method:5051} {Blank multiple:19196::"Normal BMI","Overweight","Obesity 1","Obesity 2"} Problem List Items Addressed This Visit    None      Plan:  Pap: {Blank multiple:19196::"Pap, Reflex if ASCUS","Pap Co Test","GC/CT NAAT","Not needed","Not done"} Mammogram: {Blank multiple:19196::"Ordered","Not Ordered","Not Indicated","***"} Stool Guaiac Testing:  {Blank multiple:19196::"Ordered","Not Ordered","Not  Indicated","***"} Labs: {Blank multiple:19196::"Lipid 1","FBS","TSH","Hemoglobin A1C","Vit D Level""***"} Routine preventative health maintenance measures emphasized: {Blank multiple:19196::"Exercise/Diet/Weight control","Tobacco Warnings","Alcohol/Substance use risks","Stress Management","Peer Pressure Issues","Safe Sex"} *** Return to [place], [place]

## 2016-12-17 ENCOUNTER — Ambulatory Visit (INDEPENDENT_AMBULATORY_CARE_PROVIDER_SITE_OTHER): Payer: Medicare Other | Admitting: Obstetrics and Gynecology

## 2016-12-17 ENCOUNTER — Encounter: Payer: Self-pay | Admitting: Obstetrics and Gynecology

## 2016-12-17 VITALS — BP 131/78 | HR 82 | Ht 65.0 in | Wt 221.7 lb

## 2016-12-17 DIAGNOSIS — Z6836 Body mass index (BMI) 36.0-36.9, adult: Secondary | ICD-10-CM

## 2016-12-17 DIAGNOSIS — N951 Menopausal and female climacteric states: Secondary | ICD-10-CM

## 2016-12-17 DIAGNOSIS — E6609 Other obesity due to excess calories: Secondary | ICD-10-CM | POA: Diagnosis not present

## 2016-12-17 DIAGNOSIS — IMO0001 Reserved for inherently not codable concepts without codable children: Secondary | ICD-10-CM | POA: Insufficient documentation

## 2016-12-17 DIAGNOSIS — Z01419 Encounter for gynecological examination (general) (routine) without abnormal findings: Secondary | ICD-10-CM | POA: Diagnosis not present

## 2016-12-17 DIAGNOSIS — M8580 Other specified disorders of bone density and structure, unspecified site: Secondary | ICD-10-CM

## 2016-12-17 NOTE — Progress Notes (Signed)
ANNUAL PREVENTATIVE CARE GYNECOLOGY  ENCOUNTER NOTE  Subjective:       Lauren Brooks is a 71 y.o. G15P2002 female here for a routine annual gynecologic exam. The patient is not sexually active. The patient is not taking hormone replacement therapy. Patient denies post-menopausal vaginal bleeding. The patient wears seatbelts: yes. The patient participates in regular exercise: yes. Has the patient ever been transfused or tattooed?: no. The patient reports that there is not domestic violence in her life.  Current complaints: 1.  None    Gynecologic History No LMP recorded. Patient is postmenopausal. Last Pap: 6 years ago. Results were: normal Last mammogram: 09/2016. Results were: normal Last Colonoscopy: performed 3 years ago. Normal. Due again in 2 years.  Last Dexa Scan: 2016   Obstetric History OB History  Gravida Para Term Preterm AB Living  2 2 2     2   SAB TAB Ectopic Multiple Live Births               # Outcome Date GA Lbr Len/2nd Weight Sex Delivery Anes PTL Lv  2 Term           1 Term               Past Medical History:  Diagnosis Date  . Absolute anemia 02/28/2014  . Allergic rhinitis 10/17/2015  . Arthritis 10/17/2015  . Atrial fibrillation ([place]) 10/17/2015  . Cerebral infarction ([place]) 02/28/2014  . CVA (cerebral infarction)   . Fibroid   . Hypercholesterolemia 02/28/2014  . Osteopenia 08/08/2015   Overview:  per new bone density.   . Stroke [facility])     Family History  Problem Relation Age of Onset  . Hypertension Mother   . Breast cancer Cousin 30    Past Surgical History:  Procedure Laterality Date  . BACK SURGERY     Bulging Disc, Dr. Mauri Pole  . EYE SURGERY Left    Cataract Extraction  . LAPAROSCOPIC SALPINGO OOPHERECTOMY Bilateral 10/28/2015   Procedure: LAPAROSCOPIC SALPINGO OOPHORECTOMY;  Surgeon: Rubie Maid, MD;  Location: [facility] ORS;  Service: Gynecology;  Laterality: Bilateral;  . OOPHORECTOMY Bilateral 2016  . ORIF FEMUR FRACTURE Left  03/21/15   Dr. Roland Rack, [facility]    Social History   Social History  . Marital status: Divorced    Spouse name: N/A  . Number of children: N/A  . Years of education: N/A   Occupational History  . Not on file.   Social History Main Topics  . Smoking status: Never Smoker  . Smokeless tobacco: Never Used  . Alcohol use No  . Drug use: No  . Sexual activity: No   Other Topics Concern  . Not on file   Social History Narrative  . No narrative on file    Current Outpatient Prescriptions on File Prior to Visit  Medication Sig Dispense Refill  . atorvastatin (LIPITOR) 10 MG tablet Take 10 mg by mouth daily.    . meloxicam (MOBIC) 15 MG tablet TK 1 T PO D  1  . Multiple Vitamin (MULTI VITAMIN DAILY PO) Take 1 tablet by mouth daily.    . traMADol (ULTRAM) 50 MG tablet Take 1 tablet (50 mg total) by mouth every 6 (six) hours as needed for moderate pain. May take up 1- 2 tablets every 6 hours prn pain 60 tablet 0   No current facility-administered medications on file prior to visit.     Allergies  Allergen Reactions  . Oxycodone Other (See Comments)  Altered mental status, inability to focus.      Review of Systems ROS Review of Systems - General ROS: negative for - chills, fatigue, fever, hot flashes, night sweats, weight gain or weight loss Psychological ROS: negative for - anxiety, decreased libido, depression, mood swings, physical abuse or sexual abuse Ophthalmic ROS: negative for - blurry vision, eye pain or loss of vision ENT ROS: negative for - headaches, hearing change, visual changes or vocal changes Allergy and Immunology ROS: negative for - hives, itchy/watery eyes or seasonal allergies Hematological and Lymphatic ROS: negative for - bleeding problems, bruising, swollen lymph nodes or weight loss Endocrine ROS: negative for - galactorrhea, hair pattern changes, hot flashes, malaise/lethargy, mood swings, palpitations, polydipsia/polyuria, skin changes, temperature  intolerance or unexpected weight changes Breast ROS: negative for - new or changing breast lumps or nipple discharge Respiratory ROS: negative for - cough or shortness of breath Cardiovascular ROS: negative for - chest pain, irregular heartbeat, palpitations or shortness of breath Gastrointestinal ROS: no abdominal pain, change in bowel habits, or black or bloody stools Genito-Urinary ROS: no dysuria, trouble voiding, or hematuria Musculoskeletal ROS: negative for - joint pain or joint stiffness Neurological ROS: negative for - bowel and bladder control changes Dermatological ROS: negative for rash and skin lesion changes   Objective:   BP 131/78 (BP Location: Left Arm, Patient Position: Sitting, Cuff Size: Large)   Pulse 82   Ht 5\' 5"  (1.651 m)   Wt 221 lb 11.2 oz (100.6 kg)   BMI 36.89 kg/m  CONSTITUTIONAL: Well-developed, well-nourished female in no acute distress. Moderately obese PSYCHIATRIC: Normal mood and affect. Normal behavior. Normal judgment and thought content. [place]: Alert and oriented to person, place, and time. Normal muscle tone coordination. No cranial nerve deficit noted. HENT:  Normocephalic, atraumatic, External right and left ear normal. Oropharynx is clear and moist EYES: Conjunctivae and EOM are normal. Pupils are equal, round, and reactive to light. No scleral icterus.  NECK: Normal range of motion, supple, no masses.  Normal thyroid.  SKIN: Skin is warm and dry. No rash noted. Not diaphoretic. No erythema. No pallor. CARDIOVASCULAR: Normal heart rate noted, regular rhythm, no murmur. RESPIRATORY: Clear to auscultation bilaterally. Effort and breath sounds normal, no problems with respiration noted. BREASTS: Symmetric in size. No masses, skin changes, nipple drainage, or lymphadenopathy. ABDOMEN: Soft, normal bowel sounds, no distention noted.  No tenderness, rebound or guarding.  BLADDER: Normal PELVIC:  Bladder no bladder distension noted  Urethra:  normal appearing urethra with no masses, tenderness or lesions  Vulva: normal appearing vulva with no masses, tenderness or lesions  Vagina: atrophic  Cervix: normal appearing cervix without discharge or lesions, stenotic  Uterus: uterus is normal size, shape, consistency and nontender  Adnexa: surgically absent bilateral  RV: External Exam NormaI, No Rectal Masses and Normal Sphincter tone  MUSCULOSKELETAL: Normal range of motion. No tenderness.  No cyanosis, clubbing, or edema.  2+ distal pulses. LYMPHATIC: No Axillary, Supraclavicular, or Inguinal Adenopathy.     Labs:  Lab Results  Component Value Date   WBC 4.0 10/22/2015   HGB 12.6 10/22/2015   HCT 39.6 10/22/2015   MCV 88.3 10/22/2015   PLT 161 10/22/2015      Chemistry      Component Value Date/Time   NA 140 10/22/2015 1327   NA 139 03/22/2015 0540   K 3.8 10/22/2015 1327   K 4.3 03/22/2015 0540   CL 107 10/22/2015 1327   CL 113 (H) 03/22/2015 0540  CO2 28 10/22/2015 1327   CO2 26 03/22/2015 0540   BUN 13 10/22/2015 1327   BUN 12 03/22/2015 0540   CREATININE 0.74 10/22/2015 1327   CREATININE 0.73 03/22/2015 0540      Component Value Date/Time   CALCIUM 9.6 10/22/2015 1327   CALCIUM 7.9 (L) 03/22/2015 0540   ALKPHOS 54 09/19/2015 0933   AST 25 09/19/2015 0933   ALT 24 09/19/2015 0933   BILITOT 0.6 09/19/2015 0933       No results found for: CHOL, HDL, LDLCALC, LDLDIRECT, TRIG, CHOLHDL  No results found for: TSH   Assessment:   Annual gynecologic examination in 71 y.o. menopausal female Obesity 2  Osteopenia  Plan:  Pap: Not needed Mammogram: up to date, normal Stool Guaiac Testing:  Not Indicated.  Patient has had colonoscopy which is up to date.  Labs: Not performed. Patient has appointment today with PCP as well, will have labs drawn then.  Routine preventative health maintenance measures emphasized: Exercise/Diet/Weight control, Alcohol/Substance use risks and Stress Management Osteopenia  - Dexa Scan up to date. Encouraged patient to take Vitamin D and Calcium supplements in addition to regular multivitamin.  Return to [place], MD [facility] Care

## 2017-08-27 ENCOUNTER — Other Ambulatory Visit: Payer: Self-pay | Admitting: Nurse Practitioner

## 2017-08-27 DIAGNOSIS — Z1231 Encounter for screening mammogram for malignant neoplasm of breast: Secondary | ICD-10-CM

## 2017-09-15 ENCOUNTER — Ambulatory Visit
Admission: RE | Admit: 2017-09-15 | Discharge: 2017-09-15 | Disposition: A | Discharge status: Home / Self Care | Payer: Medicare Other | Source: Ambulatory Visit | Attending: Nurse Practitioner | Admitting: Nurse Practitioner

## 2017-09-15 DIAGNOSIS — Z1231 Encounter for screening mammogram for malignant neoplasm of breast: Secondary | ICD-10-CM | POA: Insufficient documentation

## 2017-12-21 ENCOUNTER — Encounter: Payer: Medicare Other | Admitting: Obstetrics and Gynecology

## 2018-01-12 ENCOUNTER — Ambulatory Visit (INDEPENDENT_AMBULATORY_CARE_PROVIDER_SITE_OTHER): Payer: Medicare Other | Admitting: Obstetrics and Gynecology

## 2018-01-12 ENCOUNTER — Encounter: Payer: Self-pay | Admitting: Obstetrics and Gynecology

## 2018-01-12 VITALS — BP 144/87 | HR 84 | Wt 211.7 lb

## 2018-01-12 DIAGNOSIS — Z780 Asymptomatic menopausal state: Secondary | ICD-10-CM | POA: Diagnosis not present

## 2018-01-12 DIAGNOSIS — E66812 Obesity, class 2: Secondary | ICD-10-CM

## 2018-01-12 DIAGNOSIS — M8580 Other specified disorders of bone density and structure, unspecified site: Secondary | ICD-10-CM | POA: Diagnosis not present

## 2018-01-12 DIAGNOSIS — Z6835 Body mass index (BMI) 35.0-35.9, adult: Secondary | ICD-10-CM | POA: Diagnosis not present

## 2018-01-12 DIAGNOSIS — Z01419 Encounter for gynecological examination (general) (routine) without abnormal findings: Secondary | ICD-10-CM

## 2018-01-12 NOTE — Progress Notes (Signed)
ANNUAL PREVENTATIVE CARE GYNECOLOGY  ENCOUNTER NOTE  Subjective:       Lauren Brooks is a 72 y.o. G47P2002 female here for a routine annual gynecologic exam. The patient is not sexually active. The patient is not taking hormone replacement therapy. Patient denies post-menopausal vaginal bleeding. The patient wears seatbelts: yes. The patient participates in regular exercise: yes. Has the patient ever been transfused or tattooed?: no. The patient reports that there is not domestic violence in her life.  Current complaints: 1.  None    Gynecologic History No LMP recorded. Patient is postmenopausal. Last Pap: 7 years ago. Results were: normal Last mammogram: 09/2017. Results were: normal Last Colonoscopy: performed 4 years ago. Normal. Due again in 1 year.  Last Dexa Scan: 2016   Obstetric History OB History  Gravida Para Term Preterm AB Living  2 2 2     2   SAB TAB Ectopic Multiple Live Births               # Outcome Date GA Lbr Len/2nd Weight Sex Delivery Anes PTL Lv  2 Term           1 Term               Past Medical History:  Diagnosis Date  . Absolute anemia 02/28/2014  . Allergic rhinitis 10/17/2015  . Arthritis 10/17/2015  . Atrial fibrillation ([place]) 10/17/2015  . Cerebral infarction ([place]) 02/28/2014  . CVA (cerebral infarction)   . Fibroid   . Hypercholesterolemia 02/28/2014  . Osteopenia 08/08/2015   Overview:  per new bone density.   . Stroke [facility])     Family History  Problem Relation Age of Onset  . Hypertension Mother   . Breast cancer Cousin 30    Past Surgical History:  Procedure Laterality Date  . BACK SURGERY     Bulging Disc, Dr. Mauri Pole  . EYE SURGERY Left    Cataract Extraction  . LAPAROSCOPIC SALPINGO OOPHERECTOMY Bilateral 10/28/2015   Procedure: LAPAROSCOPIC SALPINGO OOPHORECTOMY;  Surgeon: Rubie Maid, MD;  Location: [facility] ORS;  Service: Gynecology;  Laterality: Bilateral;  . OOPHORECTOMY Bilateral 2016  . ORIF FEMUR FRACTURE Left  03/21/15   Dr. Roland Rack, [place] History   Socioeconomic History  . Marital status: Divorced    Spouse name: Not on file  . Number of children: Not on file  . Years of education: Not on file  . Highest education level: Not on file  Social Needs  . Financial resource strain: Not on file  . Food insecurity - worry: Not on file  . Food insecurity - inability: Not on file  . Transportation needs - medical: Not on file  . Transportation needs - non-medical: Not on file  Occupational History  . Not on file  Tobacco Use  . Smoking status: Never Smoker  . Smokeless tobacco: Never Used  Substance and Sexual Activity  . Alcohol use: No  . Drug use: No  . Sexual activity: No    Birth control/protection: None  Other Topics Concern  . Not on file  Social History Narrative  . Not on file    Current Outpatient Medications on File Prior to Visit  Medication Sig Dispense Refill  . atorvastatin (LIPITOR) 10 MG tablet Take 10 mg by mouth daily.    . Cholecalciferol (VITAMIN D-1000 MAX ST) 1000 units tablet Take by mouth.    . fluticasone (FLONASE) 50 MCG/ACT nasal spray USE TWO SPRAY(S) IN EACH NOSTRIL  ONCE DAILY    . Multiple Vitamin (MULTI VITAMIN DAILY PO) Take 1 tablet by mouth daily.    Marland Kitchen warfarin (COUMADIN) 1 MG tablet Take by mouth.    . warfarin (COUMADIN) 6 MG tablet Take by mouth.     No current facility-administered medications on file prior to visit.     Allergies  Allergen Reactions  . Oxycodone Other (See Comments)    Altered mental status, inability to focus.      Review of Systems ROS Review of Systems - General ROS: negative for - chills, fatigue, fever, hot flashes, night sweats, weight gain or weight loss Psychological ROS: negative for - anxiety, decreased libido, depression, mood swings, physical abuse or sexual abuse Ophthalmic ROS: negative for - blurry vision, eye pain or loss of vision ENT ROS: negative for - headaches, hearing change, visual changes  or vocal changes Allergy and Immunology ROS: negative for - hives, itchy/watery eyes or seasonal allergies Hematological and Lymphatic ROS: negative for - bleeding problems, bruising, swollen lymph nodes or weight loss Endocrine ROS: negative for - galactorrhea, hair pattern changes, hot flashes, malaise/lethargy, mood swings, palpitations, polydipsia/polyuria, skin changes, temperature intolerance or unexpected weight changes Breast ROS: negative for - new or changing breast lumps or nipple discharge Respiratory ROS: negative for - cough or shortness of breath Cardiovascular ROS: negative for - chest pain, irregular heartbeat, palpitations or shortness of breath Gastrointestinal ROS: no abdominal pain, change in bowel habits, or black or bloody stools Genito-Urinary ROS: no dysuria, trouble voiding, or hematuria Musculoskeletal ROS: negative for - joint pain or joint stiffness Neurological ROS: negative for - bowel and bladder control changes Dermatological ROS: negative for rash and skin lesion changes    Objective:   BP (!) 144/87   Pulse 84   Wt 211 lb 11.2 oz (96 kg)   BMI 35.23 kg/m  CONSTITUTIONAL: Well-developed, well-nourished female in no acute distress. Moderately obese PSYCHIATRIC: Normal mood and affect. Normal behavior. Normal judgment and thought content. [place]: Alert and oriented to person, place, and time. Normal muscle tone coordination. No cranial nerve deficit noted. HENT:  Normocephalic, atraumatic, External right and left ear normal. Oropharynx is clear and moist EYES: Conjunctivae and EOM are normal. Pupils are equal, round, and reactive to light. No scleral icterus.  NECK: Normal range of motion, supple, no masses.  Normal thyroid.  SKIN: Skin is warm and dry. No rash noted. Not diaphoretic. No erythema. No pallor. CARDIOVASCULAR: Normal heart rate noted, regular rhythm, no murmur. RESPIRATORY: Clear to auscultation bilaterally. Effort and breath sounds  normal, no problems with respiration noted. BREASTS: Symmetric in size. No masses, skin changes, nipple drainage, or lymphadenopathy. ABDOMEN: Soft, normal bowel sounds, no distention noted.  No tenderness, rebound or guarding.  BLADDER: Normal PELVIC:  Bladder no bladder distension noted  Urethra: normal appearing urethra with no masses, tenderness or lesions  Vulva: normal appearing vulva with no masses, tenderness or lesions  Vagina: atrophic  Cervix: normal appearing cervix without discharge or lesions, stenotic  Uterus: uterus is normal size, shape, consistency and nontender  Adnexa: surgically absent bilateral  RV: External Exam NormaI, No Rectal Masses and Normal Sphincter tone  MUSCULOSKELETAL: Normal range of motion. No tenderness.  No cyanosis, clubbing, or edema.  2+ distal pulses. LYMPHATIC: No Axillary, Supraclavicular, or Inguinal Adenopathy.    Labs:  Lab Results  Component Value Date   WBC 4.0 10/22/2015   HGB 12.6 10/22/2015   HCT 39.6 10/22/2015   MCV 88.3 10/22/2015  PLT 161 10/22/2015       Chemistry      Component Value Date/Time   NA 140 10/22/2015 1327   NA 139 03/22/2015 0540   K 3.8 10/22/2015 1327   K 4.3 03/22/2015 0540   CL 107 10/22/2015 1327   CL 113 (H) 03/22/2015 0540   CO2 28 10/22/2015 1327   CO2 26 03/22/2015 0540   BUN 13 10/22/2015 1327   BUN 12 03/22/2015 0540   CREATININE 0.74 10/22/2015 1327   CREATININE 0.73 03/22/2015 0540      Component Value Date/Time   CALCIUM 9.6 10/22/2015 1327   CALCIUM 7.9 (L) 03/22/2015 0540   ALKPHOS 54 09/19/2015 0933   AST 25 09/19/2015 0933   ALT 24 09/19/2015 0933   BILITOT 0.6 09/19/2015 0933       No results found for: CHOL, HDL, LDLCALC, LDLDIRECT, TRIG, CHOLHDL  No results found for: TSH   Assessment:   Annual gynecologic examination in 72 y.o. menopausal female Menopausal female Obesity 2  Osteopenia  Plan:  Pap: Not needed Mammogram: up to date, normal.  Stool Guaiac  Testing:  Not Indicated.  Patient has had colonoscopy which is up to date.  Labs: Not performed. Patient has appointment this week with PCP as well, will have labs drawn then.  Routine preventative health maintenance measures emphasized: Exercise/Diet/Weight control, Alcohol/Substance use risks and Stress Management Osteopenia - Dexa Scan up to date. Taking Vitamin D and Calcium supplements in addition to regular multivitamin.  Return to [place], MD [facility]

## 2018-01-12 NOTE — Progress Notes (Signed)
PT is doing well.

## 2018-07-28 ENCOUNTER — Other Ambulatory Visit: Payer: Self-pay | Admitting: Nurse Practitioner

## 2018-07-28 DIAGNOSIS — Z1231 Encounter for screening mammogram for malignant neoplasm of breast: Secondary | ICD-10-CM

## 2018-09-20 ENCOUNTER — Ambulatory Visit
Admission: RE | Admit: 2018-09-20 | Discharge: 2018-09-20 | Disposition: A | Discharge status: Home / Self Care | Payer: Medicare Other | Source: Ambulatory Visit | Attending: Nurse Practitioner | Admitting: Nurse Practitioner

## 2018-09-20 ENCOUNTER — Encounter (INDEPENDENT_AMBULATORY_CARE_PROVIDER_SITE_OTHER): Payer: Self-pay

## 2018-09-20 DIAGNOSIS — Z1231 Encounter for screening mammogram for malignant neoplasm of breast: Secondary | ICD-10-CM | POA: Diagnosis not present

## 2018-11-22 ENCOUNTER — Encounter: Payer: Self-pay | Admitting: *Deleted

## 2018-11-23 ENCOUNTER — Other Ambulatory Visit: Payer: Self-pay

## 2018-11-23 ENCOUNTER — Ambulatory Visit: Payer: Medicare Other | Admitting: Anesthesiology

## 2018-11-23 ENCOUNTER — Encounter: Payer: Self-pay | Admitting: *Deleted

## 2018-11-23 ENCOUNTER — Ambulatory Visit
Admission: RE | Admit: 2018-11-23 | Discharge: 2018-11-23 | Disposition: A | Discharge status: Home / Self Care | Payer: Medicare Other | Source: Ambulatory Visit | Attending: Internal Medicine | Admitting: Internal Medicine

## 2018-11-23 ENCOUNTER — Encounter
Admission: RE | Disposition: A | Discharge status: Home / Self Care | Payer: Self-pay | Source: Ambulatory Visit | Attending: Internal Medicine

## 2018-11-23 DIAGNOSIS — Z8673 Personal history of transient ischemic attack (TIA), and cerebral infarction without residual deficits: Secondary | ICD-10-CM | POA: Insufficient documentation

## 2018-11-23 DIAGNOSIS — Z79899 Other long term (current) drug therapy: Secondary | ICD-10-CM | POA: Diagnosis not present

## 2018-11-23 DIAGNOSIS — I4891 Unspecified atrial fibrillation: Secondary | ICD-10-CM | POA: Insufficient documentation

## 2018-11-23 DIAGNOSIS — Z86010 Personal history of colonic polyps: Secondary | ICD-10-CM | POA: Diagnosis not present

## 2018-11-23 DIAGNOSIS — Z1211 Encounter for screening for malignant neoplasm of colon: Secondary | ICD-10-CM | POA: Insufficient documentation

## 2018-11-23 DIAGNOSIS — K640 First degree hemorrhoids: Secondary | ICD-10-CM | POA: Insufficient documentation

## 2018-11-23 DIAGNOSIS — Z7901 Long term (current) use of anticoagulants: Secondary | ICD-10-CM | POA: Insufficient documentation

## 2018-11-23 HISTORY — PX: COLONOSCOPY WITH PROPOFOL: SHX5780

## 2018-11-23 LAB — PROTIME-INR
INR: 0.99
PROTHROMBIN TIME: 13 s (ref 11.4–15.2)

## 2018-11-23 SURGERY — COLONOSCOPY WITH PROPOFOL
Anesthesia: General

## 2018-11-23 MED ORDER — PROPOFOL 500 MG/50ML IV EMUL
INTRAVENOUS | Status: DC | PRN
  Administered 2018-11-23: 200 ug/kg/min via INTRAVENOUS

## 2018-11-23 MED ORDER — SODIUM CHLORIDE 0.9 % IV SOLN
INTRAVENOUS | Status: DC
  Administered 2018-11-23: 10:00:00 via INTRAVENOUS

## 2018-11-23 MED ORDER — LIDOCAINE HCL (CARDIAC) PF 100 MG/5ML IV SOSY
PREFILLED_SYRINGE | INTRAVENOUS | Status: DC | PRN
  Administered 2018-11-23: 50 mg via INTRAVENOUS

## 2018-11-23 MED ORDER — PROPOFOL 10 MG/ML IV BOLUS
INTRAVENOUS | Status: DC | PRN
  Administered 2018-11-23: 60 mg via INTRAVENOUS

## 2018-11-23 NOTE — Consult Note (Signed)
Outpatient short stay form Pre-procedure 11/23/2018 9:34 AM Teodoro K. Alice Reichert, M.D.  Primary Physician: Gaetano Net, NP   Reason for visit:  Personal hx of colon polyps  History of present illness:                            Patient presents for colonoscopy for a personal hx of colon polyps. The patient denies abdominal pain, abnormal weight loss or rectal bleeding.     Current Facility-Administered Medications:  .  0.9 %  sodium chloride infusion, , Intravenous, Continuous, [place], Benay Pike, MD  Medications Prior to Admission  Medication Sig Dispense Refill Last Dose  . atorvastatin (LIPITOR) 10 MG tablet Take 10 mg by mouth daily.   Past Week at Unknown time  . Cholecalciferol (VITAMIN D-1000 MAX ST) 1000 units tablet Take by mouth.   Past Week at Unknown time  . fluticasone (FLONASE) 50 MCG/ACT nasal spray USE TWO SPRAY(S) IN EACH NOSTRIL ONCE DAILY   11/23/2018 at 0700  . Multiple Vitamin (MULTI VITAMIN DAILY PO) Take 1 tablet by mouth daily.   Past Week at Unknown time  . warfarin (COUMADIN) 1 MG tablet Take by mouth.   11/17/2018 at Unknown time  . warfarin (COUMADIN) 6 MG tablet Take by mouth.   11/17/2018 at Unknown time     Allergies  Allergen Reactions  . Oxycodone Other (See Comments)    Altered mental status, inability to focus.      Past Medical History:  Diagnosis Date  . Absolute anemia 02/28/2014  . Allergic rhinitis 10/17/2015  . Arthritis 10/17/2015  . Atrial fibrillation ([place]) 10/17/2015  . Cerebral infarction ([place]) 02/28/2014  . CVA (cerebral infarction)   . Fibroid   . Hypercholesterolemia 02/28/2014  . Osteopenia 08/08/2015   Overview:  per new bone density.   . Stroke [facility])     Review of systems:  Otherwise negative.    Physical Exam  Gen: Alert, oriented. Appears stated age.  HEENT: [place]/AT. PERRLA. Lungs: CTA, no wheezes. CV: RR nl S1, S2. Abd: soft, benign, no masses. BS+ Ext: No edema. Pulses 2+    Planned procedures: Proceed with  colonoscopy. The patient understands the nature of the planned procedure, indications, risks, alternatives and potential complications including but not limited to bleeding, infection, perforation, damage to internal organs and possible oversedation/side effects from anesthesia. The patient agrees and gives consent to proceed.  Please refer to procedure notes for findings, recommendations and patient disposition/instructions.     Teodoro K. Alice Reichert, M.D. Gastroenterology 11/23/2018  9:34 AM

## 2018-11-23 NOTE — Anesthesia Procedure Notes (Signed)
Procedure Name: MAC Date/Time: 11/23/2018 10:22 AM Performed by: Rudean Hitt, CRNA Pre-anesthesia Checklist: Patient identified, Suction available, Patient being monitored and Timeout performed Patient Re-evaluated:Patient Re-evaluated prior to induction Oxygen Delivery Method: Nasal cannula

## 2018-11-23 NOTE — Op Note (Signed)
[facility] Gastroenterology Patient Name: Lauren Brooks Procedure Date: 11/23/2018 10:15 AM MRN: [id number] Account #: [id number] Date of Birth: 1946/04/27 Admit Type: Outpatient Age: 72 Room: [facility] ENDO ROOM 3 Gender: Female Note Status: Finalized Procedure:            Colonoscopy Indications:          High risk colon cancer surveillance: Personal history                        of colonic polyps Providers:            Benay Pike. Alice Reichert MD, MD Referring MD:         Juluis Rainier (Referring MD) Medicines:            Propofol per Anesthesia Complications:        No immediate complications. Procedure:            Pre-Anesthesia Assessment:                       - The risks and benefits of the procedure and the                        sedation options and risks were discussed with the                        patient. All questions were answered and informed                        consent was obtained.                       - Patient identification and proposed procedure were                        verified prior to the procedure by the nurse. The                        procedure was verified in the procedure room.                       - ASA Grade Assessment: III - A patient with severe                        systemic disease.                       - After reviewing the risks and benefits, the patient                        was deemed in satisfactory condition to undergo the                        procedure.                       After obtaining informed consent, the colonoscope was                        passed under direct vision. Throughout the procedure,  the patient's blood pressure, pulse, and oxygen                        saturations were monitored continuously. The                        Colonoscope was introduced through the anus and                        advanced to the the cecum, identified by appendiceal   orifice and ileocecal valve. The colonoscopy was                        performed without difficulty. The patient tolerated the                        procedure fairly well. The quality of the bowel                        preparation was good. The ileocecal valve, appendiceal                        orifice, and rectum were photographed. Findings:      The perianal and digital rectal examinations were normal. Pertinent       negatives include normal sphincter tone.      The colon (entire examined portion) appeared normal.      Non-bleeding internal hemorrhoids were found during retroflexion. The       hemorrhoids were Grade I (internal hemorrhoids that do not prolapse).      The exam was otherwise without abnormality. Impression:           - The entire examined colon is normal.                       - Non-bleeding internal hemorrhoids.                       - The examination was otherwise normal.                       - No specimens collected. Recommendation:       - Patient has a contact number available for                        emergencies. The signs and symptoms of potential                        delayed complications were discussed with the patient.                        Return to normal activities tomorrow. Written discharge                        instructions were provided to the patient.                       - Resume previous diet.                       - Continue present medications.                       -  NO repeat colonoscopy due to age/comorbid status                       - Return to GI office PRN.                       - The findings and recommendations were discussed with                        the patient and their family. Procedure Code(s):    --- Professional ---                       V7793, Colorectal cancer screening; colonoscopy on                        individual at high risk Diagnosis Code(s):    --- Professional ---                       K64.0, First degree  hemorrhoids                       Z86.010, Personal history of colonic polyps CPT copyright 2018 [employer]. All rights reserved. The codes documented in this report are preliminary and upon coder review may  be revised to meet current compliance requirements. Efrain Sella MD, MD 11/23/2018 10:48:24 AM This report has been signed electronically. Number of Addenda: 0 Note Initiated On: 11/23/2018 10:15 AM Scope Withdrawal Time: 0 hours 7 minutes 10 seconds  Total Procedure Duration: 0 hours 15 minutes 1 second       [facility]

## 2018-11-23 NOTE — Anesthesia Preprocedure Evaluation (Signed)
Anesthesia Evaluation  Patient identified by MRN, date of birth, ID band Patient awake    Reviewed: Allergy & Precautions, NPO status , Patient's Chart, lab work & pertinent test results  History of Anesthesia Complications Negative for: history of anesthetic complications  Airway Mallampati: III  TM Distance: >3 FB Neck ROM: Full    Dental no notable dental hx.    Pulmonary neg pulmonary ROS, neg sleep apnea, neg COPD,    breath sounds clear to auscultation- rhonchi (-) wheezing      Cardiovascular Exercise Tolerance: Good (-) hypertension(-) CAD, (-) Past MI, (-) Cardiac Stents and (-) CABG  Rhythm:Regular Rate:Normal - Systolic murmurs and - Diastolic murmurs    Neuro/Psych neg Seizures CVA, No Residual Symptoms negative psych ROS   GI/Hepatic negative GI ROS, Neg liver ROS,   Endo/Other  negative endocrine ROSneg diabetes  Renal/GU negative Renal ROS     Musculoskeletal  (+) Arthritis ,   Abdominal (+) + obese,   Peds  Hematology  (+) anemia ,   Anesthesia Other Findings Past Medical History: 02/28/2014: Absolute anemia 10/17/2015: Allergic rhinitis 10/17/2015: Arthritis 10/17/2015: Atrial fibrillation ([place]) 02/28/2014: Cerebral infarction ([place]) No date: CVA (cerebral infarction) No date: Fibroid 02/28/2014: Hypercholesterolemia 08/08/2015: Osteopenia     Comment:  Overview:  per new bone density.  No date: Stroke [facility])   Reproductive/Obstetrics                             Anesthesia Physical Anesthesia Plan  ASA: III  Anesthesia Plan: General   Post-op Pain Management:    Induction: Intravenous  PONV Risk Score and Plan: 2 and Propofol infusion  Airway Management Planned: Natural Airway  Additional Equipment:   Intra-op Plan:   Post-operative Plan:   Informed Consent: I have reviewed the patients History and Physical, chart, labs and discussed the procedure  including the risks, benefits and alternatives for the proposed anesthesia with the patient or authorized representative who has indicated his/her understanding and acceptance.   Dental advisory given  Plan Discussed with: CRNA and Anesthesiologist  Anesthesia Plan Comments:         Anesthesia Quick Evaluation

## 2018-11-23 NOTE — Interval H&P Note (Signed)
History and Physical Interval Note:  11/23/2018 9:35 AM  Lauren Brooks  has presented today for surgery, with the diagnosis of HX OF TA POLYPS  The various methods of treatment have been discussed with the patient and family. After consideration of risks, benefits and other options for treatment, the patient has consented to  Procedure(s): COLONOSCOPY WITH PROPOFOL (N/A) as a surgical intervention .  The patient's history has been reviewed, patient examined, no change in status, stable for surgery.  I have reviewed the patient's chart and labs.  Questions were answered to the patient's satisfaction.     [place], [place]

## 2018-11-23 NOTE — Transfer of Care (Signed)
Immediate Anesthesia Transfer of Care Note  Patient: Lauren Brooks  Procedure(s) Performed: COLONOSCOPY WITH PROPOFOL (N/A )  Patient Location: PACU  Anesthesia Type:General  Level of Consciousness: drowsy  Airway & Oxygen Therapy: Patient Spontanous Breathing and Patient connected to nasal cannula oxygen  Post-op Assessment: Report given to RN and Post -op Vital signs reviewed and stable  Post vital signs: Reviewed and stable  Last Vitals:  Vitals Value Taken Time  BP 108/74 11/23/2018 10:50 AM  Temp 36.3 C 11/23/2018 10:50 AM  Pulse 74 11/23/2018 10:51 AM  Resp 18 11/23/2018 10:51 AM  SpO2 100 % 11/23/2018 10:51 AM  Vitals shown include unvalidated device data.  Last Pain:  Vitals:   11/23/18 1050  TempSrc: Tympanic  PainSc: Asleep         Complications: No apparent anesthesia complications

## 2018-11-23 NOTE — H&P (View-Only) (Signed)
Outpatient short stay form Pre-procedure 11/23/2018 9:34 AM Laci Frenkel K. Alice Reichert, M.D.  Primary Physician: Gaetano Net, NP   Reason for visit:  Personal hx of colon polyps  History of present illness:                            Patient presents for colonoscopy for a personal hx of colon polyps. The patient denies abdominal pain, abnormal weight loss or rectal bleeding.     Current Facility-Administered Medications:  .  0.9 %  sodium chloride infusion, , Intravenous, Continuous, Azarria Balint, Benay Pike, MD  Medications Prior to Admission  Medication Sig Dispense Refill Last Dose  . atorvastatin (LIPITOR) 10 MG tablet Take 10 mg by mouth daily.   Past Week at Unknown time  . Cholecalciferol (VITAMIN D-1000 MAX ST) 1000 units tablet Take by mouth.   Past Week at Unknown time  . fluticasone (FLONASE) 50 MCG/ACT nasal spray USE TWO SPRAY(S) IN EACH NOSTRIL ONCE DAILY   11/23/2018 at 0700  . Multiple Vitamin (MULTI VITAMIN DAILY PO) Take 1 tablet by mouth daily.   Past Week at Unknown time  . warfarin (COUMADIN) 1 MG tablet Take by mouth.   11/17/2018 at Unknown time  . warfarin (COUMADIN) 6 MG tablet Take by mouth.   11/17/2018 at Unknown time     Allergies  Allergen Reactions  . Oxycodone Other (See Comments)    Altered mental status, inability to focus.      Past Medical History:  Diagnosis Date  . Absolute anemia 02/28/2014  . Allergic rhinitis 10/17/2015  . Arthritis 10/17/2015  . Atrial fibrillation ([place]) 10/17/2015  . Cerebral infarction ([place]) 02/28/2014  . CVA (cerebral infarction)   . Fibroid   . Hypercholesterolemia 02/28/2014  . Osteopenia 08/08/2015   Overview:  per new bone density.   . Stroke [facility])     Review of systems:  Otherwise negative.    Physical Exam  Gen: Alert, oriented. Appears stated age.  HEENT: [place]/AT. PERRLA. Lungs: CTA, no wheezes. CV: RR nl S1, S2. Abd: soft, benign, no masses. BS+ Ext: No edema. Pulses 2+    Planned procedures: Proceed with  colonoscopy. The patient understands the nature of the planned procedure, indications, risks, alternatives and potential complications including but not limited to bleeding, infection, perforation, damage to internal organs and possible oversedation/side effects from anesthesia. The patient agrees and gives consent to proceed.  Please refer to procedure notes for findings, recommendations and patient disposition/instructions.     Cherith Tewell K. Alice Reichert, M.D. Gastroenterology 11/23/2018  9:34 AM

## 2018-11-23 NOTE — Anesthesia Post-op Follow-up Note (Signed)
Anesthesia QCDR form completed.        

## 2018-11-23 NOTE — Anesthesia Postprocedure Evaluation (Signed)
Anesthesia Post Note  Patient: Lauren Brooks  Procedure(s) Performed: COLONOSCOPY WITH PROPOFOL (N/A )  Patient location during evaluation: Endoscopy Anesthesia Type: General Level of consciousness: awake and alert and oriented Pain management: pain level controlled Vital Signs Assessment: post-procedure vital signs reviewed and stable Respiratory status: spontaneous breathing, nonlabored ventilation and respiratory function stable Cardiovascular status: blood pressure returned to baseline and stable Postop Assessment: no signs of nausea or vomiting Anesthetic complications: no     Last Vitals:  Vitals:   11/23/18 1100 11/23/18 1109  BP: 139/83 (!) 154/85  Pulse: 72 79  Resp: (!) 22 13  Temp:    SpO2: 100% 100%    Last Pain:  Vitals:   11/23/18 1109  TempSrc:   PainSc: 0-No pain                 Chardae Mulkern

## 2019-02-03 ENCOUNTER — Other Ambulatory Visit: Payer: Self-pay | Admitting: Nurse Practitioner

## 2019-02-03 DIAGNOSIS — M85859 Other specified disorders of bone density and structure, unspecified thigh: Secondary | ICD-10-CM

## 2019-02-15 ENCOUNTER — Encounter (INDEPENDENT_AMBULATORY_CARE_PROVIDER_SITE_OTHER): Payer: Self-pay

## 2019-02-15 ENCOUNTER — Ambulatory Visit
Admission: RE | Admit: 2019-02-15 | Discharge: 2019-02-15 | Disposition: A | Discharge status: Home / Self Care | Payer: Medicare Other | Source: Ambulatory Visit | Attending: Nurse Practitioner | Admitting: Nurse Practitioner

## 2019-02-15 ENCOUNTER — Other Ambulatory Visit: Payer: Self-pay

## 2019-02-15 DIAGNOSIS — M8588 Other specified disorders of bone density and structure, other site: Secondary | ICD-10-CM | POA: Insufficient documentation

## 2019-02-15 DIAGNOSIS — M85852 Other specified disorders of bone density and structure, left thigh: Secondary | ICD-10-CM | POA: Diagnosis not present

## 2019-02-15 DIAGNOSIS — M85851 Other specified disorders of bone density and structure, right thigh: Secondary | ICD-10-CM | POA: Insufficient documentation

## 2019-02-15 DIAGNOSIS — M85859 Other specified disorders of bone density and structure, unspecified thigh: Secondary | ICD-10-CM | POA: Diagnosis present

## 2019-08-04 ENCOUNTER — Other Ambulatory Visit: Payer: Self-pay | Admitting: Nurse Practitioner

## 2019-08-04 DIAGNOSIS — Z1231 Encounter for screening mammogram for malignant neoplasm of breast: Secondary | ICD-10-CM

## 2019-08-30 ENCOUNTER — Other Ambulatory Visit: Payer: Self-pay

## 2019-08-30 ENCOUNTER — Encounter
Admission: RE | Admit: 2019-08-30 | Discharge: 2019-08-30 | Disposition: A | Discharge status: Home / Self Care | Payer: Medicare Other | Source: Ambulatory Visit | Attending: Surgery | Admitting: Surgery

## 2019-08-30 DIAGNOSIS — R9431 Abnormal electrocardiogram [ECG] [EKG]: Secondary | ICD-10-CM | POA: Insufficient documentation

## 2019-08-30 DIAGNOSIS — Z01818 Encounter for other preprocedural examination: Secondary | ICD-10-CM | POA: Insufficient documentation

## 2019-08-30 LAB — PROTIME-INR
INR: 2.3 — ABNORMAL HIGH (ref 0.8–1.2)
Prothrombin Time: 24.7 seconds — ABNORMAL HIGH (ref 11.4–15.2)

## 2019-08-30 LAB — CBC
HCT: 38.3 % (ref 36.0–46.0)
Hemoglobin: 12.4 g/dL (ref 12.0–15.0)
MCH: 28.6 pg (ref 26.0–34.0)
MCHC: 32.4 g/dL (ref 30.0–36.0)
MCV: 88.2 fL (ref 80.0–100.0)
Platelets: 143 10*3/uL — ABNORMAL LOW (ref 150–400)
RBC: 4.34 MIL/uL (ref 3.87–5.11)
RDW: 13.2 % (ref 11.5–15.5)
WBC: 3.7 10*3/uL — ABNORMAL LOW (ref 4.0–10.5)
nRBC: 0 % (ref 0.0–0.2)

## 2019-08-30 LAB — APTT: aPTT: 35 seconds (ref 24–36)

## 2019-08-30 NOTE — Patient Instructions (Signed)
Your procedure is scheduled on: Wednesday 09/06/19  Report to [facility]. To find out your arrival time please call [phone] between 1PM - 3PM on Tuesday 09/04/29   Remember: Instructions that are not followed completely may result in serious medical risk, up to and including death, or upon the discretion of your surgeon and anesthesiologist your surgery may need to be rescheduled.      _X__ 1. Do not eat food after midnight the night before your procedure.                 No gum chewing or hard candies. You may drink clear liquids up to 2 hours                 before you are scheduled to arrive for your surgery- DO NOT drink clear                 liquids within 2 hours of the start of your surgery.                 Clear Liquids include:  water, apple juice without pulp, clear carbohydrate                 drink such as Clearfast or Gatorade, Black Coffee or Tea (Do not add                 milk or creamer to coffee or tea).    __X__2.  On the morning of surgery brush your teeth with toothpaste and water, you may rinse your mouth with mouthwash if you wish.  Do not swallow any toothpaste or mouthwash.      __X__6.  Notify your doctor if there is any change in your medical condition      (cold, fever, infections).       Do not wear jewelry, make-up, hairpins, clips or nail polish. Do not wear lotions, powders, or perfumes.  Do not shave 48 hours prior to surgery. Men may shave face and neck. Do not bring valuables to the hospital.      [facility] is not responsible for any belongings or valuables.   Contacts, dentures/partials or body piercings may not be worn into surgery. Bring a case for your contacts, glasses or hearing aids, a denture cup will be supplied.    Patients discharged the day of surgery will not be allowed to drive home.    Please read over the following fact sheets that you were given:   MRSA  Information   __X__ Take these medicines the morning of surgery with A SIP OF WATER:     1. fluticasone (FLONASE) 50 MCG/ACT nasal spray  2. SYSTANE 0.4-0.3 % SOLN       __X__ Use CHG Soap wipes as directed   __X__ Stop Blood Thinners Coumadin: days prior to your procedure. Your last dose will be on Saturday morning 09/02/19.   __X__ Stop Anti-inflammatories 7 days before surgery such as Advil, Ibuprofen, Motrin, BC or Goodies Powder, Naprosyn, Naproxen, Aleve, Aspirin, Meloxicam. May take Tylenol if needed for pain or discomfort.    __X__ Don't begin taking any new herbal supplements before your procedure.

## 2019-09-01 ENCOUNTER — Other Ambulatory Visit
Admission: RE | Admit: 2019-09-01 | Discharge: 2019-09-01 | Disposition: A | Discharge status: Home / Self Care | Payer: Medicare Other | Source: Ambulatory Visit | Attending: Surgery | Admitting: Surgery

## 2019-09-01 ENCOUNTER — Other Ambulatory Visit: Payer: Self-pay

## 2019-09-01 DIAGNOSIS — Z01812 Encounter for preprocedural laboratory examination: Secondary | ICD-10-CM | POA: Diagnosis not present

## 2019-09-01 DIAGNOSIS — Z20828 Contact with and (suspected) exposure to other viral communicable diseases: Secondary | ICD-10-CM | POA: Diagnosis not present

## 2019-09-01 LAB — SARS CORONAVIRUS 2 (TAT 6-24 HRS): SARS Coronavirus 2: NEGATIVE

## 2019-09-06 ENCOUNTER — Ambulatory Visit: Payer: Medicare Other | Admitting: Anesthesiology

## 2019-09-06 ENCOUNTER — Other Ambulatory Visit: Payer: Self-pay

## 2019-09-06 ENCOUNTER — Ambulatory Visit
Admission: RE | Admit: 2019-09-06 | Discharge: 2019-09-06 | Disposition: A | Discharge status: Home / Self Care | Payer: Medicare Other | Source: Ambulatory Visit | Attending: Surgery | Admitting: Surgery

## 2019-09-06 ENCOUNTER — Encounter
Admission: RE | Disposition: A | Discharge status: Home / Self Care | Payer: Self-pay | Source: Ambulatory Visit | Attending: Surgery

## 2019-09-06 DIAGNOSIS — D649 Anemia, unspecified: Secondary | ICD-10-CM | POA: Diagnosis not present

## 2019-09-06 DIAGNOSIS — Z6834 Body mass index (BMI) 34.0-34.9, adult: Secondary | ICD-10-CM | POA: Diagnosis not present

## 2019-09-06 DIAGNOSIS — I480 Paroxysmal atrial fibrillation: Secondary | ICD-10-CM | POA: Diagnosis not present

## 2019-09-06 DIAGNOSIS — Z885 Allergy status to narcotic agent status: Secondary | ICD-10-CM | POA: Insufficient documentation

## 2019-09-06 DIAGNOSIS — M8580 Other specified disorders of bone density and structure, unspecified site: Secondary | ICD-10-CM | POA: Diagnosis not present

## 2019-09-06 DIAGNOSIS — E559 Vitamin D deficiency, unspecified: Secondary | ICD-10-CM | POA: Insufficient documentation

## 2019-09-06 DIAGNOSIS — M65311 Trigger thumb, right thumb: Secondary | ICD-10-CM | POA: Diagnosis not present

## 2019-09-06 DIAGNOSIS — Z79899 Other long term (current) drug therapy: Secondary | ICD-10-CM | POA: Diagnosis not present

## 2019-09-06 DIAGNOSIS — I69392 Facial weakness following cerebral infarction: Secondary | ICD-10-CM | POA: Diagnosis not present

## 2019-09-06 DIAGNOSIS — I69328 Other speech and language deficits following cerebral infarction: Secondary | ICD-10-CM | POA: Insufficient documentation

## 2019-09-06 DIAGNOSIS — Z7901 Long term (current) use of anticoagulants: Secondary | ICD-10-CM | POA: Insufficient documentation

## 2019-09-06 DIAGNOSIS — E669 Obesity, unspecified: Secondary | ICD-10-CM | POA: Diagnosis not present

## 2019-09-06 DIAGNOSIS — E785 Hyperlipidemia, unspecified: Secondary | ICD-10-CM | POA: Diagnosis not present

## 2019-09-06 HISTORY — PX: TRIGGER FINGER RELEASE: SHX641

## 2019-09-06 LAB — PROTIME-INR
INR: 1 (ref 0.8–1.2)
Prothrombin Time: 13.5 seconds (ref 11.4–15.2)

## 2019-09-06 SURGERY — RELEASE, A1 PULLEY, FOR TRIGGER FINGER
Anesthesia: General | Site: Thumb | Laterality: Right

## 2019-09-06 MED ORDER — PROPOFOL 500 MG/50ML IV EMUL
INTRAVENOUS | Status: AC
  Filled 2019-09-06: qty 50

## 2019-09-06 MED ORDER — FENTANYL CITRATE (PF) 100 MCG/2ML IJ SOLN
INTRAMUSCULAR | Status: AC
  Filled 2019-09-06: qty 2

## 2019-09-06 MED ORDER — PROPOFOL 10 MG/ML IV BOLUS
INTRAVENOUS | Status: AC
  Filled 2019-09-06: qty 40

## 2019-09-06 MED ORDER — CEFAZOLIN SODIUM-DEXTROSE 2-4 GM/100ML-% IV SOLN
2.0000 g | Freq: Once | INTRAVENOUS | Status: AC
  Administered 2019-09-06: 09:00:00 2 g via INTRAVENOUS

## 2019-09-06 MED ORDER — FAMOTIDINE 20 MG PO TABS
ORAL_TABLET | ORAL | Status: AC
  Administered 2019-09-06: 20 mg via ORAL
  Filled 2019-09-06: qty 1

## 2019-09-06 MED ORDER — EPHEDRINE SULFATE 50 MG/ML IJ SOLN
INTRAMUSCULAR | Status: AC
  Filled 2019-09-06: qty 1

## 2019-09-06 MED ORDER — LIDOCAINE HCL (PF) 2 % IJ SOLN
INTRAMUSCULAR | Status: AC
  Filled 2019-09-06: qty 10

## 2019-09-06 MED ORDER — BUPIVACAINE HCL (PF) 0.5 % IJ SOLN
INTRAMUSCULAR | Status: DC | PRN
  Administered 2019-09-06: 5 mL

## 2019-09-06 MED ORDER — TRAMADOL HCL 50 MG PO TABS: 50.0000 mg | ORAL_TABLET | Freq: Four times a day (QID) | ORAL | 0 refills | Status: AC | PRN

## 2019-09-06 MED ORDER — CEFAZOLIN SODIUM-DEXTROSE 2-4 GM/100ML-% IV SOLN
INTRAVENOUS | Status: AC
  Filled 2019-09-06: qty 100

## 2019-09-06 MED ORDER — BUPIVACAINE HCL (PF) 0.5 % IJ SOLN
INTRAMUSCULAR | Status: AC
  Filled 2019-09-06: qty 30

## 2019-09-06 MED ORDER — FENTANYL CITRATE (PF) 100 MCG/2ML IJ SOLN
INTRAMUSCULAR | Status: DC | PRN
  Administered 2019-09-06: 50 ug via INTRAVENOUS
  Administered 2019-09-06: 25 ug via INTRAVENOUS

## 2019-09-06 MED ORDER — LACTATED RINGERS IV SOLN
INTRAVENOUS | Status: DC
  Administered 2019-09-06: 50 mL/h via INTRAVENOUS

## 2019-09-06 MED ORDER — FENTANYL CITRATE (PF) 100 MCG/2ML IJ SOLN: 25.0000 ug | INTRAMUSCULAR | Status: DC | PRN

## 2019-09-06 MED ORDER — EPHEDRINE SULFATE 50 MG/ML IJ SOLN
INTRAMUSCULAR | Status: DC | PRN
  Administered 2019-09-06: 10 mg via INTRAVENOUS

## 2019-09-06 MED ORDER — ONDANSETRON HCL 4 MG/2ML IJ SOLN: 4.0000 mg | Freq: Once | INTRAMUSCULAR | Status: DC | PRN

## 2019-09-06 MED ORDER — MIDAZOLAM HCL 2 MG/2ML IJ SOLN
INTRAMUSCULAR | Status: AC
  Filled 2019-09-06: qty 2

## 2019-09-06 MED ORDER — ONDANSETRON HCL 4 MG/2ML IJ SOLN
INTRAMUSCULAR | Status: AC
  Filled 2019-09-06: qty 2

## 2019-09-06 MED ORDER — MIDAZOLAM HCL 5 MG/5ML IJ SOLN
INTRAMUSCULAR | Status: DC | PRN
  Administered 2019-09-06 (×2): 1 mg via INTRAVENOUS

## 2019-09-06 MED ORDER — PROPOFOL 10 MG/ML IV BOLUS
INTRAVENOUS | Status: DC | PRN
  Administered 2019-09-06: 150 mg via INTRAVENOUS

## 2019-09-06 MED ORDER — LIDOCAINE HCL 1 % IJ SOLN
INTRAMUSCULAR | Status: DC | PRN
  Administered 2019-09-06: 100 mg via INTRADERMAL

## 2019-09-06 MED ORDER — FAMOTIDINE 20 MG PO TABS
20.0000 mg | ORAL_TABLET | Freq: Once | ORAL | Status: AC
  Administered 2019-09-06: 08:00:00 20 mg via ORAL

## 2019-09-06 SURGICAL SUPPLY — 24 items
BNDG COHESIVE 1X5 TAN NS LF (GAUZE/BANDAGES/DRESSINGS) ×3 IMPLANT
BNDG CONFORM 2 STRL LF (GAUZE/BANDAGES/DRESSINGS) ×3 IMPLANT
BNDG ELASTIC 2X5.8 VLCR STR LF (GAUZE/BANDAGES/DRESSINGS) ×3 IMPLANT
BNDG ESMARK 4X12 TAN STRL LF (GAUZE/BANDAGES/DRESSINGS) ×3 IMPLANT
CHLORAPREP W/TINT 26 (MISCELLANEOUS) ×3 IMPLANT
CORD BIP STRL DISP 12FT (MISCELLANEOUS) ×3 IMPLANT
COVER WAND RF STERILE (DRAPES) ×3 IMPLANT
CUFF TOURN 18 STER (MISCELLANEOUS) ×3 IMPLANT
DRAPE SURG 17X11 SM STRL (DRAPES) ×6 IMPLANT
FORCEPS JEWEL BIP 4-3/4 STR (INSTRUMENTS) ×3 IMPLANT
GAUZE SPONGE 4X4 12PLY STRL (GAUZE/BANDAGES/DRESSINGS) ×3 IMPLANT
GAUZE XEROFORM 1X8 LF (GAUZE/BANDAGES/DRESSINGS) ×3 IMPLANT
GLOVE BIO SURGEON STRL SZ8 (GLOVE) ×6 IMPLANT
GLOVE INDICATOR 8.0 STRL GRN (GLOVE) ×3 IMPLANT
GOWN STRL REUS W/ TWL LRG LVL3 (GOWN DISPOSABLE) ×2 IMPLANT
GOWN STRL REUS W/ TWL XL LVL3 (GOWN DISPOSABLE) ×1 IMPLANT
GOWN STRL REUS W/TWL LRG LVL3 (GOWN DISPOSABLE) ×4
GOWN STRL REUS W/TWL XL LVL3 (GOWN DISPOSABLE) ×2
KIT TURNOVER KIT A (KITS) ×3 IMPLANT
NEEDLE HYPO 25X1 1.5 SAFETY (NEEDLE) ×3 IMPLANT
NS IRRIG 500ML POUR BTL (IV SOLUTION) ×3 IMPLANT
PACK EXTREMITY ARMC (MISCELLANEOUS) ×3 IMPLANT
STOCKINETTE IMPERVIOUS 9X36 MD (GAUZE/BANDAGES/DRESSINGS) ×3 IMPLANT
SUT PROLENE 4 0 PS 2 18 (SUTURE) ×3 IMPLANT

## 2019-09-06 NOTE — Anesthesia Postprocedure Evaluation (Signed)
Anesthesia Post Note  Patient: Lauren Brooks  Procedure(s) Performed: RELEASE TRIGGER FINGER/A-1 PULLEY (Right Thumb)  Patient location during evaluation: PACU Anesthesia Type: General Level of consciousness: awake and alert Pain management: pain level controlled Vital Signs Assessment: post-procedure vital signs reviewed and stable Respiratory status: spontaneous breathing and respiratory function stable Cardiovascular status: stable Anesthetic complications: no     Last Vitals:  Vitals:   09/06/19 0750 09/06/19 1000  BP: (!) 153/86 133/70  Pulse: 77 96  Resp: 18 14  Temp: 36.8 C 36.5 C  SpO2: 100% 99%    Last Pain:  Vitals:   09/06/19 1000  TempSrc:   PainSc: Asleep                 KEPHART,WILLIAM K

## 2019-09-06 NOTE — Op Note (Signed)
09/06/2019  9:50 AM  Patient:   Lauren Brooks  Pre-Op Diagnosis:   Right trigger thumb.  Post-Op Diagnosis:   Same  Procedure:   Release right trigger thumb.  Surgeon:   Pascal Lux, MD  Assistant:   Orland Penman, PA-S  Anesthesia:   General LMA  Findings:   As above.  Complications:   None  EBL:   0 cc  Fluids:   400 cc crystalloid  TT:   13 minutes at 250 mmHg  Drains:   None  Closure:   4-0 Prolene  Implants:   None  Brief Clinical Note:   The patient is a 73 year old female with a history of painful catching of her right thumb. These symptoms have progressed despite medications, activity modification, etc. The patient's history and examination were consistent with a right trigger thumb. The patient presents at this time for a right trigger thumb release.  Procedure:   The patient was brought into the operating room and lain in the supine position. After adequate general laryngeal mask anesthesia was obtained, the right hand and upper extremity were prepped with ChloraPrep solution before being draped sterilely. Preoperative antibiotics were administered. A timeout was performed to verify the appropriate surgical site before the limb was exsanguinated with an Esmarch and the tourniquet inflated to 250 mmHg. An approximately 1.5-2.0 cm incision was made over the volar aspect of the right thumb at the level of the metacarpal head centered over the flexor sheath. The incision was carried down through the subcutaneous tissues with care taken to identify and protect the digital neurovascular structures. The flexor sheath was entered just proximal to the A1 pulley. The sheath was released proximally for several centimeters under direct visualization. Distally, a clamp was placed beneath the A1 pulley and used to release any adhesions. The clamp was repositioned so that one jaw was superficial to and the other jaw deep to the A1 pulley. The A1 pulley was incised on either  side of the clamp to remove a 2 mm strip of tissue. Metzenbaum scissors were used to ensure complete release of the A1 pulley more distally. The underlying tendons were carefully inspected and found to be intact.   The wound was copiously irrigated with sterile saline solution before the wound was closed using 4-0 Prolene interrupted sutures. A total of 5 cc of 0.5% plain Sensorcaine was injected in and around the incision to help with postoperative analgesia before a sterile bulky dressing was applied to the hand. The patient was then awakened, extubated, and returned to the recovery room in satisfactory condition after tolerating the procedure well.

## 2019-09-06 NOTE — Discharge Instructions (Addendum)
AMBULATORY SURGERY  DISCHARGE INSTRUCTIONS   1) The drugs that you were given will stay in your system until tomorrow so for the next 24 hours you should not:  A) Drive an automobile B) Make any legal decisions C) Drink any alcoholic beverage   2) You may resume regular meals tomorrow.  Today it is better to start with liquids and gradually work up to solid foods.  You may eat anything you prefer, but it is better to start with liquids, then soup and crackers, and gradually work up to solid foods.   3) Please notify your doctor immediately if you have any unusual bleeding, trouble breathing, redness and pain at the surgery site, drainage, fever, or pain not relieved by medication.    4) Additional Instructions:        Please contact your physician with any problems or Same Day Surgery at [phone], Monday through Friday 6 am to 4 pm, or [place] at [facility] number at [phone].Orthopedic discharge instructions: Keep dressing dry and intact. Keep hand elevated above heart level. May shower after dressing removed on postop day 4 (Monday). Cover sutures with Band-Aids after drying off. Apply ice to affected area frequently. Take ibuprofen 600-800 mg TID with meals for 7-10 days, then as necessary. Take ES Tylenol or pain medication as prescribed when needed.  Return for follow-up in 10-14 days or as scheduled.

## 2019-09-06 NOTE — H&P (Signed)
Paper H&P to be scanned into permanent record. H&P reviewed and patient re-examined. No changes. 

## 2019-09-06 NOTE — Transfer of Care (Signed)
Immediate Anesthesia Transfer of Care Note  Patient: Lauren Brooks  Procedure(s) Performed: RELEASE TRIGGER FINGER/A-1 PULLEY (Right Thumb)  Patient Location: PACU  Anesthesia Type:General  Level of Consciousness: awake, oriented and drowsy  Airway & Oxygen Therapy: Patient Spontanous Breathing and Patient connected to face mask oxygen  Post-op Assessment: Report given to RN and Post -op Vital signs reviewed and stable  Post vital signs: Reviewed and stable  Last Vitals:  Vitals Value Taken Time  BP 133/70 09/06/19 1000  Temp 36.5 C 09/06/19 1000  Pulse 91 09/06/19 1001  Resp 14 09/06/19 1001  SpO2 100 % 09/06/19 1001  Vitals shown include unvalidated device data.  Last Pain:  Vitals:   09/06/19 0750  TempSrc: Tympanic  PainSc: 0-No pain         Complications: No apparent anesthesia complications

## 2019-09-06 NOTE — Anesthesia Procedure Notes (Signed)
Procedure Name: LMA Insertion Date/Time: 09/06/2019 9:20 AM Performed by: Babs Sciara, CRNA Pre-anesthesia Checklist: Patient identified, Emergency Drugs available, Suction available, Patient being monitored and Timeout performed Patient Re-evaluated:Patient Re-evaluated prior to induction Oxygen Delivery Method: Circle system utilized Preoxygenation: Pre-oxygenation with 100% oxygen Induction Type: IV induction Ventilation: Mask ventilation without difficulty LMA: LMA inserted LMA Size: 4.0 Placement Confirmation: breath sounds checked- equal and bilateral and positive ETCO2 Tube secured with: Tape Dental Injury: Teeth and Oropharynx as per pre-operative assessment

## 2019-09-06 NOTE — Anesthesia Post-op Follow-up Note (Signed)
Anesthesia QCDR form completed.        

## 2019-09-06 NOTE — Anesthesia Preprocedure Evaluation (Signed)
Anesthesia Evaluation  Patient identified by MRN, date of birth, ID band Patient awake    Reviewed: Allergy & Precautions, NPO status , Patient's Chart, lab work & pertinent test results  History of Anesthesia Complications Negative for: history of anesthetic complications  Airway Mallampati: III       Dental   Pulmonary neg sleep apnea, neg COPD, Not current smoker,           Cardiovascular (-) hypertension(-) Past MI and (-) CHF (-) dysrhythmias (-) Valvular Problems/Murmurs     Neuro/Psych neg Seizures CVA (speech difficulties, facial droop, resolved), Residual Symptoms    GI/Hepatic Neg liver ROS, neg GERD  ,  Endo/Other  neg diabetes  Renal/GU negative Renal ROS     Musculoskeletal   Abdominal   Peds  Hematology  (+) anemia ,   Anesthesia Other Findings   Reproductive/Obstetrics                             Anesthesia Physical Anesthesia Plan  ASA: II  Anesthesia Plan: General   Post-op Pain Management:    Induction: Intravenous  PONV Risk Score and Plan: 3 and Ondansetron, Midazolam and Dexamethasone  Airway Management Planned: LMA  Additional Equipment:   Intra-op Plan:   Post-operative Plan:   Informed Consent: I have reviewed the patients History and Physical, chart, labs and discussed the procedure including the risks, benefits and alternatives for the proposed anesthesia with the patient or authorized representative who has indicated his/her understanding and acceptance.       Plan Discussed with:   Anesthesia Plan Comments:         Anesthesia Quick Evaluation

## 2019-09-25 ENCOUNTER — Ambulatory Visit
Admission: RE | Admit: 2019-09-25 | Discharge: 2019-09-25 | Disposition: A | Discharge status: Home / Self Care | Payer: Medicare Other | Source: Ambulatory Visit | Attending: Nurse Practitioner | Admitting: Nurse Practitioner

## 2019-09-25 ENCOUNTER — Other Ambulatory Visit: Payer: Self-pay

## 2019-09-25 DIAGNOSIS — Z1231 Encounter for screening mammogram for malignant neoplasm of breast: Secondary | ICD-10-CM | POA: Diagnosis present

## 2020-09-11 ENCOUNTER — Other Ambulatory Visit: Payer: Self-pay | Admitting: Nurse Practitioner

## 2020-09-11 DIAGNOSIS — Z1231 Encounter for screening mammogram for malignant neoplasm of breast: Secondary | ICD-10-CM

## 2020-10-02 ENCOUNTER — Other Ambulatory Visit: Payer: Self-pay

## 2020-10-02 ENCOUNTER — Ambulatory Visit
Admission: RE | Admit: 2020-10-02 | Discharge: 2020-10-02 | Disposition: A | Discharge status: Home / Self Care | Payer: Medicare PPO | Source: Ambulatory Visit | Attending: Nurse Practitioner | Admitting: Nurse Practitioner

## 2020-10-02 DIAGNOSIS — Z1231 Encounter for screening mammogram for malignant neoplasm of breast: Secondary | ICD-10-CM

## 2021-09-24 ENCOUNTER — Other Ambulatory Visit: Payer: Self-pay | Admitting: Nurse Practitioner

## 2021-09-24 DIAGNOSIS — Z1231 Encounter for screening mammogram for malignant neoplasm of breast: Secondary | ICD-10-CM

## 2021-10-20 ENCOUNTER — Ambulatory Visit
Admission: RE | Admit: 2021-10-20 | Discharge: 2021-10-20 | Disposition: A | Discharge status: Home / Self Care | Payer: Medicare PPO | Source: Ambulatory Visit | Attending: Nurse Practitioner | Admitting: Nurse Practitioner

## 2021-10-20 ENCOUNTER — Other Ambulatory Visit: Payer: Self-pay

## 2021-10-20 DIAGNOSIS — Z1231 Encounter for screening mammogram for malignant neoplasm of breast: Secondary | ICD-10-CM | POA: Insufficient documentation

## 2022-01-24 DIAGNOSIS — S22000A Wedge compression fracture of unspecified thoracic vertebra, initial encounter for closed fracture: Secondary | ICD-10-CM | POA: Insufficient documentation

## 2022-04-13 ENCOUNTER — Other Ambulatory Visit: Payer: Self-pay | Admitting: Nurse Practitioner

## 2022-04-13 DIAGNOSIS — R9389 Abnormal findings on diagnostic imaging of other specified body structures: Secondary | ICD-10-CM

## 2022-04-16 ENCOUNTER — Ambulatory Visit
Admission: RE | Admit: 2022-04-16 | Discharge: 2022-04-16 | Disposition: A | Discharge status: Home / Self Care | Payer: Medicare PPO | Source: Ambulatory Visit | Attending: Nurse Practitioner | Admitting: Nurse Practitioner

## 2022-04-16 DIAGNOSIS — R9389 Abnormal findings on diagnostic imaging of other specified body structures: Secondary | ICD-10-CM

## 2022-05-14 ENCOUNTER — Encounter: Payer: Self-pay | Admitting: Obstetrics and Gynecology

## 2022-06-18 NOTE — Progress Notes (Unsigned)
    GYNECOLOGY PROGRESS NOTE  Subjective:    Patient ID: Lauren Brooks, female    DOB: [birth year], 76 y.o.   MRN: [id number]  HPI  Patient is a 76 y.o. [id number]  postmenopausal female who presents for she was referred to [facility] for evaluation of thickened endometrium. Incidentally noted on CT abd of heterogenous endometrium measuring up to 2.0cm in thickness, abnormal in post-menopausal patient. It was recommended that she have a transvaginal ultrasound for further evaluation. She has not had this done yet.  The following portions of the patient's history were reviewed and updated as appropriate: allergies, current medications, past family history, past medical history, past social history, past surgical history, and problem list.  Review of Systems Pertinent items are noted in HPI.   Objective:   Blood pressure (!) 157/83, pulse 91, resp. rate 16, height 5' 4.5" (1.638 m), weight 211 lb 14.4 oz (96.1 kg). Body mass index is 35.81 kg/m. General appearance: {general exam:16600} Abdomen: {abdominal exam:16834} Pelvic: {pelvic exam:16852::"cervix normal in appearance","external genitalia normal","no adnexal masses or tenderness","no cervical motion tenderness","rectovaginal septum normal","uterus normal size, shape, and consistency","vagina normal without discharge"} Extremities: {extremity exam:5109} Neurologic: {neuro exam:17854}   Assessment:   No diagnosis found.   Plan:   There are no diagnoses linked to this encounter.     Rubie Maid, MD [facility]

## 2022-06-19 ENCOUNTER — Other Ambulatory Visit (HOSPITAL_COMMUNITY)
Admission: RE | Admit: 2022-06-19 | Discharge: 2022-06-19 | Disposition: A | Discharge status: Home / Self Care | Payer: Medicare PPO | Source: Ambulatory Visit | Attending: Obstetrics and Gynecology | Admitting: Obstetrics and Gynecology

## 2022-06-19 ENCOUNTER — Ambulatory Visit: Payer: Medicare PPO | Admitting: Obstetrics and Gynecology

## 2022-06-19 ENCOUNTER — Encounter: Payer: Self-pay | Admitting: Obstetrics and Gynecology

## 2022-06-19 VITALS — BP 157/83 | HR 91 | Resp 16 | Ht 64.5 in | Wt 211.9 lb

## 2022-06-19 DIAGNOSIS — Z780 Asymptomatic menopausal state: Secondary | ICD-10-CM | POA: Diagnosis not present

## 2022-06-19 DIAGNOSIS — R9389 Abnormal findings on diagnostic imaging of other specified body structures: Secondary | ICD-10-CM

## 2022-06-19 NOTE — Progress Notes (Incomplete)
GYNECOLOGY PROGRESS NOTE  Subjective:    Patient ID: Lauren Brooks, female    DOB: 03-26-1946, 76 y.o.   MRN: [id number]  HPI  Patient is a 76 y.o. [id number]  postmenopausal female who presents as a referral for thickened endometrium. Incidentally noted on CT of abdomen performed several months ago (performed in [place]).  Scan noted heterogenous endometrium measuring up to 2.0 cm in thickness. It was recommended by her PCP that she have a transvaginal ultrasound for further evaluation which noted similar findings (1.6 cm).  Patient denies postmenopausal bleeding.     Past Medical History:  Diagnosis Date  . Absolute anemia 02/28/2014  . Allergic rhinitis 10/17/2015  . Arthritis 10/17/2015  . Atrial fibrillation ([place]) 10/17/2015  . Cerebral infarction ([place]) 02/28/2014  . CVA (cerebral infarction)   . Fibroid   . Hypercholesterolemia 02/28/2014  . Osteopenia 08/08/2015   Overview:  per new bone density.   . Stroke [facility])     Family History  Problem Relation Age of Onset  . Hypertension Mother   . Breast cancer Cousin 85       mat cousin    Past Surgical History:  Procedure Laterality Date  . BACK SURGERY     Bulging Disc, Dr. Mauri Pole  . COLONOSCOPY    . COLONOSCOPY WITH PROPOFOL N/A 11/23/2018   Procedure: COLONOSCOPY WITH PROPOFOL;  Surgeon: Toledo, Benay Pike, MD;  Location: [facility] ENDOSCOPY;  Service: Gastroenterology;  Laterality: N/A;  . EYE SURGERY Left    Cataract Extraction  . LAMINECTOMY    . LAPAROSCOPIC SALPINGO OOPHERECTOMY Bilateral 10/28/2015   Procedure: LAPAROSCOPIC SALPINGO OOPHORECTOMY;  Surgeon: Rubie Maid, MD;  Location: [facility] ORS;  Service: Gynecology;  Laterality: Bilateral;  . OOPHORECTOMY  2016   1 ovary removed  . ORIF FEMUR FRACTURE Left 03/21/15   Dr. Roland Rack, [facility]  . TRIGGER FINGER RELEASE Right 09/06/2019   Procedure: RELEASE TRIGGER FINGER/A-1 PULLEY;  Surgeon: Corky Mull, MD;  Location: [facility] ORS;  Service: Orthopedics;  Laterality:  Right;    Social History   Socioeconomic History  . Marital status: Divorced    Spouse name: Not on file  . Number of children: Not on file  . Years of education: Not on file  . Highest education level: Not on file  Occupational History  . Not on file  Tobacco Use  . Smoking status: Never  . Smokeless tobacco: Never  Vaping Use  . Vaping Use: Never used  Substance and Sexual Activity  . Alcohol use: No  . Drug use: No  . Sexual activity: Not Currently    Birth control/protection: None  Other Topics Concern  . Not on file  Social History Narrative  . Not on file   Social Determinants of Health   Financial Resource Strain: Not on file  Food Insecurity: Not on file  Transportation Needs: Not on file  Physical Activity: Not on file  Stress: Not on file  Social Connections: Not on file  Intimate Partner Violence: Not on file    Current Outpatient Medications on File Prior to Visit  Medication Sig Dispense Refill  . atorvastatin (LIPITOR) 10 MG tablet Take 10 mg by mouth at bedtime.     . calcium carbonate (OSCAL) 1500 (600 Ca) MG TABS tablet Take 600 mg by mouth 2 (two) times daily.    . Cholecalciferol (VITAMIN D3) 50 MCG (2000 UT) TABS Take 2,000 Units by mouth 2 (two) times daily.    . fluticasone (FLONASE) 50  MCG/ACT nasal spray Place 1 spray into both nostrils 2 (two) times daily.     . Multiple Vitamin (MULTIVITAMIN WITH MINERALS) TABS tablet Take 1 tablet by mouth daily.    . SYSTANE 0.4-0.3 % SOLN Place 1 drop into both eyes 2 (two) times daily.    Marland Kitchen warfarin (COUMADIN) 1 MG tablet Take 1 mg by mouth every Monday, Tuesday, Wednesday, Thursday, and Friday. In the evening.    . warfarin (COUMADIN) 6 MG tablet Take 6 mg by mouth every evening. Take 1 tablet (6 mg) by mouth with a 1 mg tablet on Mondays, Tuesdays, Wednesdays, Thursdays, & Fridays for total daily dose=7 mg Take 1 tablet ('6mg'$ ) by mouth on Sundays & Saturday for total daily dose=6 mg     No current  facility-administered medications on file prior to visit.    Allergies  Allergen Reactions  . Oxycodone Other (See Comments)    Altered mental status, inability to focus.      Review of Systems Pertinent items are noted in HPI.   Objective:   Blood pressure (!) 157/83, pulse 91, resp. rate 16, height 5' 4.5" (1.638 m), weight 211 lb 14.4 oz (96.1 kg). Body mass index is 35.81 kg/m. General appearance: alert and no distress Abdomen: soft, non-tender; bowel sounds normal; no masses,  no organomegaly Pelvic: external genitalia normal, rectovaginal septum normal.  Vagina without discharge.  Cervix normal appearing, no lesions and no motion tenderness.  Uterus mobile, nontender, normal shape and size.  Adnexae non-palpable, nontender bilaterally.  Extremities: extremities normal, atraumatic, no cyanosis or edema Neurologic: Grossly normal    Imaging:  US PELVIC COMPLETE WITH TRANSVAGINAL CLINICAL DATA:  Abnormal CT with thickened endometrial complex, postmenopausal, prior BILATERAL salpingo oophorectomy  EXAM: TRANSABDOMINAL AND TRANSVAGINAL ULTRASOUND OF PELVIS  TECHNIQUE: Both transabdominal and transvaginal ultrasound examinations of the pelvis were performed. Transabdominal technique was performed for global imaging of the pelvis including uterus, ovaries, adnexal regions, and pelvic cul-de-sac. It was necessary to proceed with endovaginal exam following the transabdominal exam to visualize the uterus, endometrium, and ovaries.  COMPARISON:  None  Correlation: Remote CT abdomen pelvis 09/19/2015  FINDINGS: Uterus  Measurements: 7.9 x 2.8 x 5.7 cm = volume: 66 mL. Retroverted. Mildly heterogeneous myometrium without focal mass  Endometrium  Thickness: 16 mm. Markedly thickened and heterogeneous. Single small calcification. 5 mm endometrial cyst x 2. No fluid.  Right ovary  Surgically absent  Left ovary  Surgically absent  Other findings  No free pelvic  fluid or additional adnexal masses.  IMPRESSION: Surgical absence of ovaries.  Markedly thickened heterogeneous endometrial complex 16 mm thick, abnormal for an asymptomatic post-menopausal female; endometrial sampling recommended to exclude carcinoma.  These results will be called to the ordering clinician or representative by the Radiologist Assistant, and communication documented in the PACS or [employer].  Electronically Signed   By: Lavonia Dana M.D.   On: 04/16/2022 14:07   CT Scan Abdomen/Pelvis W IV Contrast Narrative  EXAM: CT ABDOMEN PELVIS W CONTRAST  DATE: 01/24/2022 3:44 AM  ACCESSION: [id number] UN  DICTATED: 01/24/2022 3:54 AM  INTERPRETATION LOCATION: [place]   CLINICAL INDICATION: 76 years old with rectal pain, constipation, urinary retention concern for diverticultis vs stercoral colitis ; Diverticulitis suspected     COMPARISON: None   TECHNIQUE: A helical CT scan of the abdomen and pelvis was obtained following IV contrast from the lung bases through the pubic symphysis. Images were reconstructed in the axial plane. Coronal and sagittal reformatted  images were also provided for further evaluation.    FINDINGS:   LOWER CHEST: Nodular groundglass opacity in the posterior segment of the left lower lobe (2:32).   LIVER: Normal liver contour. Multiple hepatic lesions with fluid-attenuation measuring up to 1.9 cm (2:45), likely benign. Additional subcentimeter hypoattenuating hepatic densities, too small to further characterize by CT.   BILIARY: No biliary ductal dilatation.  The gallbladder is normal in appearance.   SPLEEN: Normal in size and contour.   PANCREAS: Normal pancreatic contour.  No focal lesions.  No ductal dilation.   ADRENAL GLANDS: Normal appearance of the adrenal glands.   KIDNEYS/URETERS: Symmetric renal enhancement.  No hydronephrosis.  No solid renal mass. Right kidney inferior pole 0.4 cm calculus (2:64). Hyperdense renal  pyramids are favored to represent excreted contrast.   BLADDER: Unremarkable.   REPRODUCTIVE ORGANS: Fibroid uterus.   GI TRACT: Normal appendix (2:79). Large volume of stool in the cecum/ascending colon and in the rectum. There is wall thickening and soft tissue stranding surrounding the rectum. No pneumatosis.   PERITONEUM, RETROPERITONEUM AND MESENTERY: No free air.  No ascites.  No fluid collection.   LYMPH NODES: No adenopathy.   VESSELS: Hepatic and portal veins are patent.  Normal caliber aorta.     BONES and SOFT TISSUES: No aggressive osseous lesions.  Right ventral wall fat-containing hernia. Left femur internal fixation is partially visualized. Moderate multilevel degenerative changes of the spine. Age-indeterminate compression deformities of the T7, and T10 vertebral bodies. Procedure Note  Gar Gibbon, MD - 01/24/2022  Formatting of this note might be different from the original.  EXAM: CT ABDOMEN PELVIS W CONTRAST  DATE: 01/24/2022 3:44 AM  ACCESSION: [id number] UN  DICTATED: 01/24/2022 3:54 AM  INTERPRETATION LOCATION: [employer]   Assessment:   1. Thickened endometrium   2. Postmenopausal     Plan:   Discussed thickened endometrium in the setting of a postmenopausal female. Denies vaginal bleeding. Recommend that due to the thickness of the lining, that I would recommend performing an endometrial biopsy. Patient in agreement with plan, ok to perform today.  See procedure note below.  Will follow up with results on Mychart.      Rubie Maid, MD [facility]

## 2022-06-20 ENCOUNTER — Encounter: Payer: Self-pay | Admitting: Obstetrics and Gynecology

## 2022-06-20 NOTE — Patient Instructions (Signed)
ENDOMETRIAL BIOPSY POST-PROCEDURE INSTRUCTIONS  You may take Ibuprofen, Aleve or Tylenol for pain if needed.  Cramping should resolve within in 24 hours.  You may have a small amount of spotting.  You should wear a mini pad for the next few days.  You may have intercourse after 24 hours.  You need to call if you have any pelvic pain, fever, heavy bleeding or foul smelling vaginal discharge.  Shower or bathe as normal  6. We will call you within one week with results or we will discuss   the results at your follow-up appointment if needed.

## 2022-06-23 LAB — SURGICAL PATHOLOGY

## 2022-06-23 MED ORDER — NORETHINDRONE ACETATE 5 MG PO TABS: 5.0000 mg | ORAL_TABLET | Freq: Every day | ORAL | 0 refills | Status: DC

## 2022-06-23 NOTE — Addendum Note (Signed)
Addended by: Augusto Gamble on: 06/23/2022 11:58 PM   Modules accepted: Orders

## 2022-06-24 ENCOUNTER — Other Ambulatory Visit: Payer: Self-pay | Admitting: Obstetrics and Gynecology

## 2022-07-02 NOTE — Progress Notes (Signed)
Yes, we can push it out a week.

## 2022-07-14 ENCOUNTER — Other Ambulatory Visit: Payer: Medicare PPO

## 2022-07-21 ENCOUNTER — Ambulatory Visit (INDEPENDENT_AMBULATORY_CARE_PROVIDER_SITE_OTHER): Payer: Medicare PPO

## 2022-07-21 DIAGNOSIS — R9389 Abnormal findings on diagnostic imaging of other specified body structures: Secondary | ICD-10-CM

## 2022-08-04 ENCOUNTER — Telehealth: Payer: Self-pay | Admitting: Obstetrics and Gynecology

## 2022-08-04 NOTE — Telephone Encounter (Signed)
Patient had u/s done on 07/21/22 and would like someone to call her regarding the results.   CB# [phone]

## 2022-08-04 NOTE — Telephone Encounter (Signed)
I sent her a Mychart message a while back. She needed to follow up in the office as her ultrasound showed that she had several uterine polyps.

## 2022-08-07 NOTE — Progress Notes (Unsigned)
    GYNECOLOGY PROGRESS NOTE  Subjective:     Patient ID: Lauren Brooks, female    DOB: [birth year], 76 y.o.   MRN: [id number]  HPI  Patient is a 76 y.o. G85P2002 female who presents for follow up on Ultrasound Results. She had for thickened endometrium on previous ultrasound.  The following portions of the patient's history were reviewed and updated as appropriate: allergies, current medications, past family history, past medical history, past social history, past surgical history, and problem list.  Review of Systems Pertinent items are noted in HPI.   Objective:   There were no vitals taken for this visit. There is no height or weight on file to calculate BMI. General appearance: alert, cooperative, and no distress Abdomen: {abdominal exam:16834} Pelvic: {pelvic exam:16852::"cervix normal in appearance","external genitalia normal","no adnexal masses or tenderness","no cervical motion tenderness","rectovaginal septum normal","uterus normal size, shape, and consistency","vagina normal without discharge"} Extremities: {extremity exam:5109} Neurologic: {neuro exam:17854}   Assessment:   No diagnosis found.    Plan:   There are no diagnoses linked to this encounter.      Rubie Maid, MD [facility]

## 2022-08-11 ENCOUNTER — Encounter: Payer: Self-pay | Admitting: Obstetrics and Gynecology

## 2022-08-11 ENCOUNTER — Ambulatory Visit: Payer: Medicare PPO | Admitting: Obstetrics and Gynecology

## 2022-08-11 VITALS — BP 159/76 | HR 82 | Ht 65.0 in | Wt 214.3 lb

## 2022-08-11 DIAGNOSIS — N840 Polyp of corpus uteri: Secondary | ICD-10-CM

## 2022-08-11 DIAGNOSIS — D259 Leiomyoma of uterus, unspecified: Secondary | ICD-10-CM

## 2022-08-11 DIAGNOSIS — N950 Postmenopausal bleeding: Secondary | ICD-10-CM

## 2022-08-11 NOTE — Patient Instructions (Signed)
GYNECOLOGY PRE-OPERATIVE INSTRUCTIONS  You are scheduled for surgery on 07/17/2022.  The name of your procedure is: Hysteroscopy D&C with polypectomy.   Please read through these instructions carefully regarding preparation for your surgery: Nothing to eat after midnight on the day prior to surgery.  Do not take any medications unless recommended by your provider on day prior to surgery.  Do not take NSAIDs (Motrin, Aleve) or aspirin 7 days prior to surgery.  You may take Tylenol products for minor aches and pains.  You will receive a prescription for pain medications post-operatively.  You will be contacted by phone approximately 1 week prior to surgery to schedule your pre-operative appointment.  Please call the office if you have any questions regarding your upcoming surgery.    Thank you for choosing [facility].    Hysteroscopy Hysteroscopy is a procedure used to look inside a woman's womb (uterus). This may be done for various reasons, including: To look for tumors and other growths in the uterus. To evaluate abnormal bleeding, fibroid tumors, polyps, scar tissue, or uterine cancer. To determine why a woman is unable to get pregnant or has had repeated pregnancy losses (miscarriages). To locate an intrauterine device (IUD). To place a birth control device into the fallopian tubes. During this procedure, a thin, flexible tube with a small light and camera (hysteroscope) is used to examine the uterus. The camera sends images to a monitor in the room so that your health care provider can view the inside of your uterus. A hysteroscopy should be done right after a menstrual period. Tell a health care provider about: Any allergies you have. All medicines you are taking, including vitamins, herbs, eye drops, creams, and over-the-counter medicines. Any problems you or family members have had with anesthetic medicines. Any bleeding problems you have. Any surgeries you have  had. Any medical conditions you have. Whether you are pregnant or may be pregnant. Whether you have been diagnosed with an sexually transmitted infection (STI) or you think you have an STI. What are the risks? Your health care provider will talk with you about risks. These may include: Excessive bleeding. Infection. Damage to the uterus or other structures or organs. Allergic reaction to medicines or fluids that are used in the procedure. What happens before the procedure? When to stop eating and drinking Follow instructions from your health care provider about what you may eat and drink. These may include: 8 hours before your procedure Stop eating most foods. Do not eat meat, fried foods, or fatty foods. Eat only light foods, such as toast or crackers. All liquids are okay except energy drinks and alcohol. 6 hours before your procedure Stop eating. Drink only clear liquids, such as water, clear fruit juice, black coffee, plain tea, and sports drinks. Do not drink energy drinks or alcohol. 2 hours before your procedure Stop drinking all liquids. You may be allowed to take medicines with small sips of water. If you do not follow your health care provider's instructions, your procedure may be delayed or canceled. Medicines Ask your health care provider about: Changing or stopping your regular medicines. These include any diabetes medicines or blood thinners you take. Taking medicines such as aspirin and ibuprofen. These medicines can thin your blood. Do not take them unless your health care provider tells you to. Taking over-the-counter medicines, vitamins, herbs, and supplements. Medicine may be placed in your cervix the day before the procedure. This medicine causes the cervix to open (dilate). The larger opening  makes it easier for the hysteroscope to be inserted into the uterus during the procedure. General instructions Ask your health care provider: What steps will be taken to  help prevent infection. These steps may include: Washing skin with a soap that kills germs. Taking antibiotic medicine. Do not use any products that contain nicotine or tobacco for at least 4 weeks before the procedure. These products include cigarettes, chewing tobacco, and vaping devices, such as e-cigarettes. If you need help quitting, ask your health care provider. If you will be going home right after the procedure, plan to have a responsible adult: Take you home from the hospital or clinic. You will not be allowed to drive. Care for you for the time you are told. Empty your bladder before the procedure begins. What happens during the procedure? An IV will be inserted into one of your veins. You may be given: A sedative. This helps you relax. Anesthesia. This will: Numb certain areas of your body. Make you fall asleep for surgery. A hysteroscope will be inserted through your vagina and into your uterus. Air or fluid will be used to enlarge your uterus to allow your health care provider to see it better. The amount of fluid used will be carefully checked throughout the procedure. In some cases, tissue may be gently scraped from inside the uterus and sent to a lab for testing (biopsy). The procedure may vary among health care providers and hospitals. What happens after the procedure? Your blood pressure, heart rate, breathing rate, and blood oxygen level will be monitored until you leave the hospital or clinic. You may have cramps. You may be given medicines for this. You may have bleeding, which may vary from light spotting to menstrual-like bleeding. This is normal. If you had a biopsy, it is up to you to get the results. Ask your health care provider, or the department that is doing the procedure, when your results will be ready. Summary Hysteroscopy is a procedure that is used to look inside a woman's womb (uterus). After the procedure, you may have bleeding, which varies from light  spotting to menstrual-like bleeding. This is normal. You may also have cramps. Plan to have a responsible adult take you home from the hospital or clinic. If you had a biopsy, it is up to you to get the results. Ask your health care provider, or the department that is doing the procedure, when your results will be ready. This information is not intended to replace advice given to you by your health care provider. Make sure you discuss any questions you have with your health care provider. Document Revised: 03/09/2022 Document Reviewed: 03/09/2022 Elsevier Patient Education  [place].

## 2022-08-11 NOTE — H&P (Signed)
GYNECOLOGY PREOPERATIVE HISTORY AND PHYSICAL   Subjective:  Lauren Brooks is a 76 y.o. [id number] here for surgical management of endometrial polyps, postmenopausal bleeding.  Significant preoperative concern: chronic warfarin use  Proposed surgery: Hysteroscopy D&C, polypectomy    Pertinent Gynecological History: Menses: post-menopausal Bleeding: post menopausal bleeding x 1 episode    Past Medical History:  Diagnosis Date   Absolute anemia 02/28/2014   Allergic rhinitis 10/17/2015   Arthritis 10/17/2015   Atrial fibrillation ([place]) 10/17/2015   Cerebral infarction ([place]) 02/28/2014   CVA (cerebral infarction)    Fibroid    Hypercholesterolemia 02/28/2014   Osteopenia 08/08/2015   Overview:  per new bone density.    Stroke [facility])     Past Surgical History:  Procedure Laterality Date   BACK SURGERY     Bulging Disc, Dr. Mauri Pole   COLONOSCOPY     COLONOSCOPY WITH PROPOFOL N/A 11/23/2018   Procedure: COLONOSCOPY WITH PROPOFOL;  Surgeon: Toledo, Benay Pike, MD;  Location: [facility] ENDOSCOPY;  Service: Gastroenterology;  Laterality: N/A;   EYE SURGERY Left    Cataract Extraction   LAMINECTOMY     LAPAROSCOPIC SALPINGO OOPHERECTOMY Bilateral 10/28/2015   Procedure: LAPAROSCOPIC SALPINGO OOPHORECTOMY;  Surgeon: Rubie Maid, MD;  Location: [facility] ORS;  Service: Gynecology;  Laterality: Bilateral;   OOPHORECTOMY  2016   1 ovary removed   ORIF FEMUR FRACTURE Left 03/21/15   Dr. Roland Rack, [place] Right 09/06/2019   Procedure: RELEASE TRIGGER FINGER/A-1 PULLEY;  Surgeon: Corky Mull, MD;  Location: [facility] ORS;  Service: Orthopedics;  Laterality: Right;    OB History  Gravida Para Term Preterm AB Living  '2 2 2     2  '$ SAB IAB Ectopic Multiple Live Births               # Outcome Date GA Lbr Len/2nd Weight Sex Delivery Anes PTL Lv  2 Term           1 Term           Patient denies any other pertinent gynecologic issues.  Family History  Problem Relation Age  of Onset   Hypertension Mother    Breast cancer Cousin 29       mat cousin    Social History   Socioeconomic History   Marital status: Divorced    Spouse name: Not on file   Number of children: Not on file   Years of education: Not on file   Highest education level: Not on file  Occupational History   Not on file  Tobacco Use   Smoking status: Never   Smokeless tobacco: Never  Vaping Use   Vaping Use: Never used  Substance and Sexual Activity   Alcohol use: No   Drug use: No   Sexual activity: Not Currently    Birth control/protection: None  Other Topics Concern   Not on file  Social History Narrative   Not on file   Social Determinants of Health   Financial Resource Strain: Not on file  Food Insecurity: Not on file  Transportation Needs: Not on file  Physical Activity: Not on file  Stress: Not on file  Social Connections: Not on file  Intimate Partner Violence: Not on file    Current Outpatient Medications on File Prior to Visit  Medication Sig Dispense Refill   atorvastatin (LIPITOR) 10 MG tablet Take 10 mg by mouth at bedtime.      calcium carbonate (OSCAL) 1500 (600  Ca) MG TABS tablet Take 600 mg by mouth 2 (two) times daily.     Cholecalciferol (VITAMIN D3) 50 MCG (2000 UT) TABS Take 2,000 Units by mouth 2 (two) times daily.     fluticasone (FLONASE) 50 MCG/ACT nasal spray Place 1 spray into both nostrils 2 (two) times daily.      Multiple Vitamin (MULTIVITAMIN WITH MINERALS) TABS tablet Take 1 tablet by mouth daily.     norethindrone (AYGESTIN) 5 MG tablet Take 1 tablet (5 mg total) by mouth daily. 30 tablet 0   SYSTANE 0.4-0.3 % SOLN Place 1 drop into both eyes 2 (two) times daily.     warfarin (COUMADIN) 1 MG tablet Take 1 mg by mouth every Monday, Tuesday, Wednesday, Thursday, and Friday. In the evening.     warfarin (COUMADIN) 6 MG tablet Take 6 mg by mouth every evening. Take 1 tablet (6 mg) by mouth with a 1 mg tablet on Mondays, Tuesdays, Wednesdays,  Thursdays, & Fridays for total daily dose=7 mg Take 1 tablet ('6mg'$ ) by mouth on Sundays & Saturday for total daily dose=6 mg     No current facility-administered medications on file prior to visit.    Allergies  Allergen Reactions   Oxycodone Other (See Comments)    Altered mental status, inability to focus.      Review of Systems Constitutional: No recent fever/chills/sweats Respiratory: No recent cough/bronchitis Cardiovascular: No chest pain Gastrointestinal: No recent nausea/vomiting/diarrhea Genitourinary: No UTI symptoms Hematologic/lymphatic:No history of coagulopathy or recent blood thinner use    Objective:   Blood pressure (!) 159/76, pulse 82, height '5\' 5"'$  (1.651 m), weight 214 lb 4.8 oz (97.2 kg). CONSTITUTIONAL: Well-developed, well-nourished female in no acute distress.  HENT:  Normocephalic, atraumatic, External right and left ear normal. Oropharynx is clear and moist EYES: Conjunctivae and EOM are normal. Pupils are equal, round, and reactive to light. No scleral icterus.  NECK: Normal range of motion, supple, no masses SKIN: Skin is warm and dry. No rash noted. Not diaphoretic. No erythema. No pallor. NEUROLOGIC: Alert and oriented to person, place, and time. Normal reflexes, muscle tone coordination. No cranial nerve deficit noted. PSYCHIATRIC: Normal mood and affect. Normal behavior. Normal judgment and thought content. CARDIOVASCULAR: Normal heart rate noted, regular rhythm RESPIRATORY: Effort and breath sounds normal, no problems with respiration noted ABDOMEN: Soft, nontender, nondistended. PELVIC: Deferred MUSCULOSKELETAL: Normal range of motion. No edema and no tenderness. 2+ distal pulses.    Labs: A. ENDOMETRIUM, BIOPSY (06/19/2022):  - Benign inactive endometrium  - Negative for hyperplasia or malignancy   Imaging Studies: US PELVIC COMPLETE WITH TRANSVAGINAL  Result Date: 07/23/2022 Patient Name: Lauren Brooks DOB: 03/14/1946 MRN:  [id number] LMP: postmenopausal ULTRASOUND REPORT [place] Date of Service: 07/21/2022 Indications: f/u ultrasound for thickened endometrium (04/16/22) Findings: The uterus is retroverted and measures 6.9 x 2.7 x 6.0cm. Echo texture is heterogenous with evidence of focal masses. Within the uterus are multiple suspected fibroids measuring: Fibroid 1: calcified, 2.1cm Fibroid 2: 0.7cm The Endometrium measures 2.17m. Within the endometrium are at least 2 echogenic areas with cystic changes and single feeding arteries; suspicious for polyps, measuring 2.6 x 1.2cm and 1.8 x 1.4cm. Right Ovary: surgically absent Left Ovary:  surgically absent Survey of the adnexa demonstrates no adnexal masses. There is no free fluid in the cul de sac. Impression: 1. Fibroids 2. Endometrial polyps (at least 2) with single feeding arteries Recommendations: 1.Clinical correlation with the patient's History and Physical Exam. KEdwena Bunde RDMS, RVT  I have reviewed this study and agree with documented findings. Rubie Maid, MD [facility]     US PELVIC COMPLETE WITH TRANSVAGINAL CLINICAL DATA:  Abnormal CT with thickened endometrial complex, postmenopausal, prior BILATERAL salpingo oophorectomy   EXAM: TRANSABDOMINAL AND TRANSVAGINAL ULTRASOUND OF PELVIS   TECHNIQUE: Both transabdominal and transvaginal ultrasound examinations of the pelvis were performed. Transabdominal technique was performed for global imaging of the pelvis including uterus, ovaries, adnexal regions, and pelvic cul-de-sac. It was necessary to proceed with endovaginal exam following the transabdominal exam to visualize the uterus, endometrium, and ovaries.   COMPARISON:  None   Correlation: Remote CT abdomen pelvis 09/19/2015   FINDINGS: Uterus   Measurements: 7.9 x 2.8 x 5.7 cm = volume: 66 mL. Retroverted. Mildly heterogeneous myometrium without focal mass   Endometrium   Thickness: 16 mm. Markedly  thickened and heterogeneous. Single small calcification. 5 mm endometrial cyst x 2. No fluid.   Right ovary   Surgically absent   Left ovary   Surgically absent   Other findings   No free pelvic fluid or additional adnexal masses.   IMPRESSION: Surgical absence of ovaries.   Markedly thickened heterogeneous endometrial complex 16 mm thick, abnormal for an asymptomatic post-menopausal female; endometrial sampling recommended to exclude carcinoma.   These results will be called to the ordering clinician or representative by the Radiologist Assistant, and communication documented in the PACS or [employer].   Electronically Signed   By: Lavonia Dana M.D.   On: 04/16/2022 14:07     CT Scan Abdomen/Pelvis W IV Contrast Narrative   EXAM: CT ABDOMEN PELVIS W CONTRAST  DATE: 01/24/2022 3:44 AM  ACCESSION: [id number] UN  DICTATED: 01/24/2022 3:54 AM  INTERPRETATION LOCATION: [place]   CLINICAL INDICATION: 76 years old with rectal pain, constipation, urinary retention concern for diverticultis vs stercoral colitis ; Diverticulitis suspected     COMPARISON: None   TECHNIQUE: A helical CT scan of the abdomen and pelvis was obtained following IV contrast from the lung bases through the pubic symphysis. Images were reconstructed in the axial plane. Coronal and sagittal reformatted images were also provided for further evaluation.    FINDINGS:   LOWER CHEST: Nodular groundglass opacity in the posterior segment of the left lower lobe (2:32).   LIVER: Normal liver contour. Multiple hepatic lesions with fluid-attenuation measuring up to 1.9 cm (2:45), likely benign. Additional subcentimeter hypoattenuating hepatic densities, too small to further characterize by CT.   BILIARY: No biliary ductal dilatation.  The gallbladder is normal in appearance.   SPLEEN: Normal in size and contour.   PANCREAS: Normal pancreatic contour.  No focal lesions.  No ductal dilation.    ADRENAL GLANDS: Normal appearance of the adrenal glands.   KIDNEYS/URETERS: Symmetric renal enhancement.  No hydronephrosis.  No solid renal mass. Right kidney inferior pole 0.4 cm calculus (2:64). Hyperdense renal pyramids are favored to represent excreted contrast.   BLADDER: Unremarkable.   REPRODUCTIVE ORGANS: Fibroid uterus.   GI TRACT: Normal appendix (2:79). Large volume of stool in the cecum/ascending colon and in the rectum. There is wall thickening and soft tissue stranding surrounding the rectum. No pneumatosis.   PERITONEUM, RETROPERITONEUM AND MESENTERY: No free air.  No ascites.  No fluid collection.   LYMPH NODES: No adenopathy.   VESSELS: Hepatic and portal veins are patent.  Normal caliber aorta.     BONES and SOFT TISSUES: No aggressive osseous lesions.  Right ventral wall fat-containing hernia. Left femur internal fixation is  partially visualized. Moderate multilevel degenerative changes of the spine. Age-indeterminate compression deformities of the T7, and T10 vertebral bodies. Procedure Note   Gar Gibbon, MD - 01/24/2022  Formatting of this note might be different from the original.  EXAM: CT ABDOMEN PELVIS W CONTRAST  DATE: 01/24/2022 3:44 AM  ACCESSION: [id number] UN  DICTATED: 01/24/2022 3:54 AM  INTERPRETATION LOCATION: [place]     Assessment:    Endometrial polyp Postmenopausal bleeding   Plan:   - Counseling: Procedure, risks, reasons, benefits and complications (including injury to bowel, bladder, major blood vessel, bleeding, possibility of transfusion, infection, or fistula formation) reviewed in detail. Likelihood of success in alleviating the patient's condition was discussed. Routine postoperative instructions will be reviewed with the patient and her family in detail after surgery.  The patient concurred with the proposed plan, giving informed written consent for the surgery.   - Preop testing ordered. - Instructions reviewed,  including NPO after midnight.   Rubie Maid, MD [facility]

## 2022-08-11 NOTE — H&P (View-Only) (Signed)
GYNECOLOGY PREOPERATIVE HISTORY AND PHYSICAL   Subjective:  Lauren Brooks is a 76 y.o. [id number] here for surgical management of endometrial polyps, postmenopausal bleeding.  Significant preoperative concern: chronic warfarin use  Proposed surgery: Hysteroscopy D&C, polypectomy    Pertinent Gynecological History: Menses: post-menopausal Bleeding: post menopausal bleeding x 1 episode    Past Medical History:  Diagnosis Date   Absolute anemia 02/28/2014   Allergic rhinitis 10/17/2015   Arthritis 10/17/2015   Atrial fibrillation ([place]) 10/17/2015   Cerebral infarction ([place]) 02/28/2014   CVA (cerebral infarction)    Fibroid    Hypercholesterolemia 02/28/2014   Osteopenia 08/08/2015   Overview:  per new bone density.    Stroke [facility])     Past Surgical History:  Procedure Laterality Date   BACK SURGERY     Bulging Disc, Dr. Mauri Pole   COLONOSCOPY     COLONOSCOPY WITH PROPOFOL N/A 11/23/2018   Procedure: COLONOSCOPY WITH PROPOFOL;  Surgeon: Toledo, Benay Pike, MD;  Location: [facility] ENDOSCOPY;  Service: Gastroenterology;  Laterality: N/A;   EYE SURGERY Left    Cataract Extraction   LAMINECTOMY     LAPAROSCOPIC SALPINGO OOPHERECTOMY Bilateral 10/28/2015   Procedure: LAPAROSCOPIC SALPINGO OOPHORECTOMY;  Surgeon: Rubie Maid, MD;  Location: [facility] ORS;  Service: Gynecology;  Laterality: Bilateral;   OOPHORECTOMY  2016   1 ovary removed   ORIF FEMUR FRACTURE Left 03/21/15   Dr. Roland Rack, [place] Right 09/06/2019   Procedure: RELEASE TRIGGER FINGER/A-1 PULLEY;  Surgeon: Corky Mull, MD;  Location: [facility] ORS;  Service: Orthopedics;  Laterality: Right;    OB History  Gravida Para Term Preterm AB Living  '2 2 2     2  '$ SAB IAB Ectopic Multiple Live Births               # Outcome Date GA Lbr Len/2nd Weight Sex Delivery Anes PTL Lv  2 Term           1 Term           Patient denies any other pertinent gynecologic issues.  Family History  Problem Relation Age  of Onset   Hypertension Mother    Breast cancer Cousin 16       mat cousin    Social History   Socioeconomic History   Marital status: Divorced    Spouse name: Not on file   Number of children: Not on file   Years of education: Not on file   Highest education level: Not on file  Occupational History   Not on file  Tobacco Use   Smoking status: Never   Smokeless tobacco: Never  Vaping Use   Vaping Use: Never used  Substance and Sexual Activity   Alcohol use: No   Drug use: No   Sexual activity: Not Currently    Birth control/protection: None  Other Topics Concern   Not on file  Social History Narrative   Not on file   Social Determinants of Health   Financial Resource Strain: Not on file  Food Insecurity: Not on file  Transportation Needs: Not on file  Physical Activity: Not on file  Stress: Not on file  Social Connections: Not on file  Intimate Partner Violence: Not on file    Current Outpatient Medications on File Prior to Visit  Medication Sig Dispense Refill   atorvastatin (LIPITOR) 10 MG tablet Take 10 mg by mouth at bedtime.      calcium carbonate (OSCAL) 1500 (600  Ca) MG TABS tablet Take 600 mg by mouth 2 (two) times daily.     Cholecalciferol (VITAMIN D3) 50 MCG (2000 UT) TABS Take 2,000 Units by mouth 2 (two) times daily.     fluticasone (FLONASE) 50 MCG/ACT nasal spray Place 1 spray into both nostrils 2 (two) times daily.      Multiple Vitamin (MULTIVITAMIN WITH MINERALS) TABS tablet Take 1 tablet by mouth daily.     norethindrone (AYGESTIN) 5 MG tablet Take 1 tablet (5 mg total) by mouth daily. 30 tablet 0   SYSTANE 0.4-0.3 % SOLN Place 1 drop into both eyes 2 (two) times daily.     warfarin (COUMADIN) 1 MG tablet Take 1 mg by mouth every Monday, Tuesday, Wednesday, Thursday, and Friday. In the evening.     warfarin (COUMADIN) 6 MG tablet Take 6 mg by mouth every evening. Take 1 tablet (6 mg) by mouth with a 1 mg tablet on Mondays, Tuesdays, Wednesdays,  Thursdays, & Fridays for total daily dose=7 mg Take 1 tablet ('6mg'$ ) by mouth on Sundays & Saturday for total daily dose=6 mg     No current facility-administered medications on file prior to visit.    Allergies  Allergen Reactions   Oxycodone Other (See Comments)    Altered mental status, inability to focus.      Review of Systems Constitutional: No recent fever/chills/sweats Respiratory: No recent cough/bronchitis Cardiovascular: No chest pain Gastrointestinal: No recent nausea/vomiting/diarrhea Genitourinary: No UTI symptoms Hematologic/lymphatic:No history of coagulopathy or recent blood thinner use    Objective:   Blood pressure (!) 159/76, pulse 82, height '5\' 5"'$  (1.651 m), weight 214 lb 4.8 oz (97.2 kg). CONSTITUTIONAL: Well-developed, well-nourished female in no acute distress.  HENT:  Normocephalic, atraumatic, External right and left ear normal. Oropharynx is clear and moist EYES: Conjunctivae and EOM are normal. Pupils are equal, round, and reactive to light. No scleral icterus.  NECK: Normal range of motion, supple, no masses SKIN: Skin is warm and dry. No rash noted. Not diaphoretic. No erythema. No pallor. NEUROLOGIC: Alert and oriented to person, place, and time. Normal reflexes, muscle tone coordination. No cranial nerve deficit noted. PSYCHIATRIC: Normal mood and affect. Normal behavior. Normal judgment and thought content. CARDIOVASCULAR: Normal heart rate noted, regular rhythm RESPIRATORY: Effort and breath sounds normal, no problems with respiration noted ABDOMEN: Soft, nontender, nondistended. PELVIC: Deferred MUSCULOSKELETAL: Normal range of motion. No edema and no tenderness. 2+ distal pulses.    Labs: A. ENDOMETRIUM, BIOPSY (06/19/2022):  - Benign inactive endometrium  - Negative for hyperplasia or malignancy   Imaging Studies: US PELVIC COMPLETE WITH TRANSVAGINAL  Result Date: 07/23/2022 Patient Name: Lauren Brooks DOB: 12/12/1945 MRN:  [id number] LMP: postmenopausal ULTRASOUND REPORT [place] Date of Service: 07/21/2022 Indications: f/u ultrasound for thickened endometrium (04/16/22) Findings: The uterus is retroverted and measures 6.9 x 2.7 x 6.0cm. Echo texture is heterogenous with evidence of focal masses. Within the uterus are multiple suspected fibroids measuring: Fibroid 1: calcified, 2.1cm Fibroid 2: 0.7cm The Endometrium measures 2.27m. Within the endometrium are at least 2 echogenic areas with cystic changes and single feeding arteries; suspicious for polyps, measuring 2.6 x 1.2cm and 1.8 x 1.4cm. Right Ovary: surgically absent Left Ovary:  surgically absent Survey of the adnexa demonstrates no adnexal masses. There is no free fluid in the cul de sac. Impression: 1. Fibroids 2. Endometrial polyps (at least 2) with single feeding arteries Recommendations: 1.Clinical correlation with the patient's History and Physical Exam. KEdwena Bunde RDMS, RVT  I have reviewed this study and agree with documented findings. Rubie Maid, MD [facility]     US PELVIC COMPLETE WITH TRANSVAGINAL CLINICAL DATA:  Abnormal CT with thickened endometrial complex, postmenopausal, prior BILATERAL salpingo oophorectomy   EXAM: TRANSABDOMINAL AND TRANSVAGINAL ULTRASOUND OF PELVIS   TECHNIQUE: Both transabdominal and transvaginal ultrasound examinations of the pelvis were performed. Transabdominal technique was performed for global imaging of the pelvis including uterus, ovaries, adnexal regions, and pelvic cul-de-sac. It was necessary to proceed with endovaginal exam following the transabdominal exam to visualize the uterus, endometrium, and ovaries.   COMPARISON:  None   Correlation: Remote CT abdomen pelvis 09/19/2015   FINDINGS: Uterus   Measurements: 7.9 x 2.8 x 5.7 cm = volume: 66 mL. Retroverted. Mildly heterogeneous myometrium without focal mass   Endometrium   Thickness: 16 mm. Markedly  thickened and heterogeneous. Single small calcification. 5 mm endometrial cyst x 2. No fluid.   Right ovary   Surgically absent   Left ovary   Surgically absent   Other findings   No free pelvic fluid or additional adnexal masses.   IMPRESSION: Surgical absence of ovaries.   Markedly thickened heterogeneous endometrial complex 16 mm thick, abnormal for an asymptomatic post-menopausal female; endometrial sampling recommended to exclude carcinoma.   These results will be called to the ordering clinician or representative by the Radiologist Assistant, and communication documented in the PACS or [employer].   Electronically Signed   By: Lavonia Dana M.D.   On: 04/16/2022 14:07     CT Scan Abdomen/Pelvis W IV Contrast Narrative   EXAM: CT ABDOMEN PELVIS W CONTRAST  DATE: 01/24/2022 3:44 AM  ACCESSION: [id number] UN  DICTATED: 01/24/2022 3:54 AM  INTERPRETATION LOCATION: [place]   CLINICAL INDICATION: 76 years old with rectal pain, constipation, urinary retention concern for diverticultis vs stercoral colitis ; Diverticulitis suspected     COMPARISON: None   TECHNIQUE: A helical CT scan of the abdomen and pelvis was obtained following IV contrast from the lung bases through the pubic symphysis. Images were reconstructed in the axial plane. Coronal and sagittal reformatted images were also provided for further evaluation.    FINDINGS:   LOWER CHEST: Nodular groundglass opacity in the posterior segment of the left lower lobe (2:32).   LIVER: Normal liver contour. Multiple hepatic lesions with fluid-attenuation measuring up to 1.9 cm (2:45), likely benign. Additional subcentimeter hypoattenuating hepatic densities, too small to further characterize by CT.   BILIARY: No biliary ductal dilatation.  The gallbladder is normal in appearance.   SPLEEN: Normal in size and contour.   PANCREAS: Normal pancreatic contour.  No focal lesions.  No ductal dilation.    ADRENAL GLANDS: Normal appearance of the adrenal glands.   KIDNEYS/URETERS: Symmetric renal enhancement.  No hydronephrosis.  No solid renal mass. Right kidney inferior pole 0.4 cm calculus (2:64). Hyperdense renal pyramids are favored to represent excreted contrast.   BLADDER: Unremarkable.   REPRODUCTIVE ORGANS: Fibroid uterus.   GI TRACT: Normal appendix (2:79). Large volume of stool in the cecum/ascending colon and in the rectum. There is wall thickening and soft tissue stranding surrounding the rectum. No pneumatosis.   PERITONEUM, RETROPERITONEUM AND MESENTERY: No free air.  No ascites.  No fluid collection.   LYMPH NODES: No adenopathy.   VESSELS: Hepatic and portal veins are patent.  Normal caliber aorta.     BONES and SOFT TISSUES: No aggressive osseous lesions.  Right ventral wall fat-containing hernia. Left femur internal fixation is  partially visualized. Moderate multilevel degenerative changes of the spine. Age-indeterminate compression deformities of the T7, and T10 vertebral bodies. Procedure Note   Gar Gibbon, MD - 01/24/2022  Formatting of this note might be different from the original.  EXAM: CT ABDOMEN PELVIS W CONTRAST  DATE: 01/24/2022 3:44 AM  ACCESSION: [id number] UN  DICTATED: 01/24/2022 3:54 AM  INTERPRETATION LOCATION: [place]     Assessment:    Endometrial polyp Postmenopausal bleeding   Plan:   - Counseling: Procedure, risks, reasons, benefits and complications (including injury to bowel, bladder, major blood vessel, bleeding, possibility of transfusion, infection, or fistula formation) reviewed in detail. Likelihood of success in alleviating the patient's condition was discussed. Routine postoperative instructions will be reviewed with the patient and her family in detail after surgery.  The patient concurred with the proposed plan, giving informed written consent for the surgery.   - Preop testing ordered. - Instructions reviewed,  including NPO after midnight.   Rubie Maid, MD [facility]

## 2022-08-13 ENCOUNTER — Other Ambulatory Visit: Payer: Self-pay

## 2022-08-13 ENCOUNTER — Encounter
Admission: RE | Admit: 2022-08-13 | Discharge: 2022-08-13 | Disposition: A | Discharge status: Home / Self Care | Payer: Medicare PPO | Source: Ambulatory Visit | Attending: Obstetrics and Gynecology | Admitting: Obstetrics and Gynecology

## 2022-08-13 DIAGNOSIS — Z01812 Encounter for preprocedural laboratory examination: Secondary | ICD-10-CM

## 2022-08-13 HISTORY — DX: Personal history of urinary calculi: Z87.442

## 2022-08-13 NOTE — Patient Instructions (Addendum)
Your procedure is scheduled on: 08/17/22 - Monday Report to the Registration Desk on the 1st floor of the [facility]. To find out your arrival time, please call [phone] between 1PM - 3PM on: 08/14/22 - Friday If your arrival time is 6:00 am, do not arrive prior to that time as the [facility] entrance doors do not open until 6:00 am.  REMEMBER: Instructions that are not followed completely may result in serious medical risk, up to and including death; or upon the discretion of your surgeon and anesthesiologist your surgery may need to be rescheduled.  Do not eat food or drink any fluids after midnight the night before surgery.  No gum chewing, lozengers or hard candies.  TAKE THESE MEDICATIONS THE MORNING OF SURGERY WITH A SIP OF WATER:  - SYSTANE - fluticasone (FLONASE)   STOP taking Coumadin beginning 08/14/22 , may resume taking with MD order.  One week prior to surgery: Stop Anti-inflammatories (NSAIDS) such as Advil, Aleve, Ibuprofen, Motrin, Naproxen, Naprosyn and Aspirin based products such as Excedrin, Goodys Powder, BC Powder.  Stop ANY OVER THE COUNTER supplements until after surgery.  You may take Tylenol if needed for pain up until the day of surgery.  No Alcohol for 24 hours before or after surgery.  No Smoking including e-cigarettes for 24 hours prior to surgery.  No chewable tobacco products for at least 6 hours prior to surgery.  No nicotine patches on the day of surgery.  Do not use any "recreational" drugs for at least a week prior to your surgery.  Please be advised that the combination of cocaine and anesthesia may have negative outcomes, up to and including death. If you test positive for cocaine, your surgery will be cancelled.  On the morning of surgery brush your teeth with toothpaste and water, you may rinse your mouth with mouthwash if you wish. Do not swallow any toothpaste or mouthwash.  Do not wear jewelry, make-up, hairpins, clips or nail  polish.  Do not wear lotions, powders, or perfumes.   Do not shave body from the neck down 48 hours prior to surgery just in case you cut yourself which could leave a site for infection.  Also, freshly shaved skin may become irritated if using the CHG soap.  Contact lenses, hearing aids and dentures may not be worn into surgery.  Do not bring valuables to the hospital. [facility] is not responsible for any missing/lost belongings or valuables.   Notify your doctor if there is any change in your medical condition (cold, fever, infection).  Wear comfortable clothing (specific to your surgery type) to the hospital.  After surgery, you can help prevent lung complications by doing breathing exercises.  Take deep breaths and cough every 1-2 hours. Your doctor may order a device called an Incentive Spirometer to help you take deep breaths. When coughing or sneezing, hold a pillow firmly against your incision with both hands. This is called "splinting." Doing this helps protect your incision. It also decreases belly discomfort.  If you are being admitted to the hospital overnight, leave your suitcase in the car. After surgery it may be brought to your room.  If you are being discharged the day of surgery, you will not be allowed to drive home. You will need a responsible adult (18 years or older) to drive you home and stay with you that night.   If you are taking public transportation, you will need to have a responsible adult (18 years or  older) with you. Please confirm with your physician that it is acceptable to use public transportation.   Please call the [facility] at [phone] if you have any questions about these instructions.  Surgery Visitation Policy:  Patients undergoing a surgery or procedure may have two family members or support persons with them as long as the person is not COVID-19 positive or experiencing its symptoms.   Inpatient Visitation:     Visiting hours are 7 a.m. to 8 p.m. Up to four visitors are allowed at one time in a patient room, including children. The visitors may rotate out with other people during the day. One designated support person (adult) may remain overnight.

## 2022-08-14 ENCOUNTER — Encounter: Payer: Self-pay | Admitting: Urgent Care

## 2022-08-14 ENCOUNTER — Encounter
Admission: RE | Admit: 2022-08-14 | Discharge: 2022-08-14 | Disposition: A | Discharge status: Home / Self Care | Payer: Medicare PPO | Source: Ambulatory Visit | Attending: Obstetrics and Gynecology | Admitting: Obstetrics and Gynecology

## 2022-08-14 DIAGNOSIS — Z7901 Long term (current) use of anticoagulants: Secondary | ICD-10-CM | POA: Diagnosis not present

## 2022-08-14 DIAGNOSIS — Z01818 Encounter for other preprocedural examination: Secondary | ICD-10-CM | POA: Insufficient documentation

## 2022-08-14 DIAGNOSIS — Z01812 Encounter for preprocedural laboratory examination: Secondary | ICD-10-CM

## 2022-08-14 DIAGNOSIS — N950 Postmenopausal bleeding: Secondary | ICD-10-CM | POA: Diagnosis not present

## 2022-08-14 HISTORY — DX: Long term (current) use of anticoagulants: Z79.01

## 2022-08-14 LAB — CBC
HCT: 42.4 % (ref 36.0–46.0)
Hemoglobin: 13.6 g/dL (ref 12.0–15.0)
MCH: 28.7 pg (ref 26.0–34.0)
MCHC: 32.1 g/dL (ref 30.0–36.0)
MCV: 89.5 fL (ref 80.0–100.0)
Platelets: 170 10*3/uL (ref 150–400)
RBC: 4.74 MIL/uL (ref 3.87–5.11)
RDW: 13.2 % (ref 11.5–15.5)
WBC: 3.5 10*3/uL — ABNORMAL LOW (ref 4.0–10.5)
nRBC: 0 % (ref 0.0–0.2)

## 2022-08-14 LAB — TYPE AND SCREEN
ABO/RH(D): A POS
Antibody Screen: NEGATIVE

## 2022-08-14 LAB — PROTIME-INR
INR: 2.4 — ABNORMAL HIGH (ref 0.8–1.2)
Prothrombin Time: 25.6 seconds — ABNORMAL HIGH (ref 11.4–15.2)

## 2022-08-17 ENCOUNTER — Other Ambulatory Visit: Payer: Self-pay

## 2022-08-17 ENCOUNTER — Ambulatory Visit: Payer: Medicare PPO | Admitting: Urgent Care

## 2022-08-17 ENCOUNTER — Ambulatory Visit
Admission: RE | Admit: 2022-08-17 | Discharge: 2022-08-17 | Disposition: A | Discharge status: Home / Self Care | Payer: Medicare PPO | Attending: Obstetrics and Gynecology | Admitting: Obstetrics and Gynecology

## 2022-08-17 ENCOUNTER — Encounter
Admission: RE | Disposition: A | Discharge status: Home / Self Care | Payer: Self-pay | Source: Home / Self Care | Attending: Obstetrics and Gynecology

## 2022-08-17 ENCOUNTER — Ambulatory Visit: Payer: Medicare PPO | Admitting: Certified Registered"

## 2022-08-17 ENCOUNTER — Encounter: Payer: Self-pay | Admitting: Obstetrics and Gynecology

## 2022-08-17 DIAGNOSIS — Z90722 Acquired absence of ovaries, bilateral: Secondary | ICD-10-CM | POA: Insufficient documentation

## 2022-08-17 DIAGNOSIS — Z8673 Personal history of transient ischemic attack (TIA), and cerebral infarction without residual deficits: Secondary | ICD-10-CM | POA: Insufficient documentation

## 2022-08-17 DIAGNOSIS — R9389 Abnormal findings on diagnostic imaging of other specified body structures: Secondary | ICD-10-CM | POA: Insufficient documentation

## 2022-08-17 DIAGNOSIS — N840 Polyp of corpus uteri: Secondary | ICD-10-CM | POA: Diagnosis not present

## 2022-08-17 DIAGNOSIS — E669 Obesity, unspecified: Secondary | ICD-10-CM | POA: Insufficient documentation

## 2022-08-17 DIAGNOSIS — I4891 Unspecified atrial fibrillation: Secondary | ICD-10-CM | POA: Diagnosis not present

## 2022-08-17 DIAGNOSIS — D259 Leiomyoma of uterus, unspecified: Secondary | ICD-10-CM | POA: Insufficient documentation

## 2022-08-17 DIAGNOSIS — Z6835 Body mass index (BMI) 35.0-35.9, adult: Secondary | ICD-10-CM | POA: Insufficient documentation

## 2022-08-17 DIAGNOSIS — Z7901 Long term (current) use of anticoagulants: Secondary | ICD-10-CM | POA: Diagnosis not present

## 2022-08-17 DIAGNOSIS — N950 Postmenopausal bleeding: Secondary | ICD-10-CM | POA: Diagnosis not present

## 2022-08-17 DIAGNOSIS — Z01812 Encounter for preprocedural laboratory examination: Secondary | ICD-10-CM

## 2022-08-17 DIAGNOSIS — E7800 Pure hypercholesterolemia, unspecified: Secondary | ICD-10-CM | POA: Diagnosis not present

## 2022-08-17 HISTORY — PX: HYSTEROSCOPY WITH D & C: SHX1775

## 2022-08-17 HISTORY — PX: POLYPECTOMY: SHX5525

## 2022-08-17 LAB — PROTIME-INR
INR: 1.2 (ref 0.8–1.2)
Prothrombin Time: 15.1 seconds (ref 11.4–15.2)

## 2022-08-17 SURGERY — DILATATION AND CURETTAGE /HYSTEROSCOPY
Anesthesia: General

## 2022-08-17 MED ORDER — PHENYLEPHRINE 80 MCG/ML (10ML) SYRINGE FOR IV PUSH (FOR BLOOD PRESSURE SUPPORT)
PREFILLED_SYRINGE | INTRAVENOUS | Status: AC
  Filled 2022-08-17: qty 10

## 2022-08-17 MED ORDER — KETOROLAC TROMETHAMINE 15 MG/ML IJ SOLN
INTRAMUSCULAR | Status: DC | PRN
  Administered 2022-08-17: 15 mg via INTRAVENOUS

## 2022-08-17 MED ORDER — DEXAMETHASONE SODIUM PHOSPHATE 10 MG/ML IJ SOLN
INTRAMUSCULAR | Status: DC | PRN
  Administered 2022-08-17: 10 mg via INTRAVENOUS

## 2022-08-17 MED ORDER — DEXAMETHASONE SODIUM PHOSPHATE 10 MG/ML IJ SOLN
INTRAMUSCULAR | Status: AC
  Filled 2022-08-17: qty 1

## 2022-08-17 MED ORDER — ACETAMINOPHEN 500 MG PO TABS: 1000.0000 mg | ORAL_TABLET | Freq: Four times a day (QID) | ORAL | 0 refills | Status: AC | PRN

## 2022-08-17 MED ORDER — FENTANYL CITRATE (PF) 100 MCG/2ML IJ SOLN: 25.0000 ug | INTRAMUSCULAR | Status: DC | PRN

## 2022-08-17 MED ORDER — FENTANYL CITRATE (PF) 100 MCG/2ML IJ SOLN
INTRAMUSCULAR | Status: AC
  Filled 2022-08-17: qty 2

## 2022-08-17 MED ORDER — EPHEDRINE SULFATE (PRESSORS) 50 MG/ML IJ SOLN
INTRAMUSCULAR | Status: DC | PRN
  Administered 2022-08-17: 5 mg via INTRAVENOUS
  Administered 2022-08-17: 10 mg via INTRAVENOUS

## 2022-08-17 MED ORDER — ONDANSETRON HCL 4 MG/2ML IJ SOLN
INTRAMUSCULAR | Status: AC
  Filled 2022-08-17: qty 2

## 2022-08-17 MED ORDER — LACTATED RINGERS IV SOLN: INTRAVENOUS | Status: DC

## 2022-08-17 MED ORDER — CHLORHEXIDINE GLUCONATE 0.12 % MT SOLN
15.0000 mL | Freq: Once | OROMUCOSAL | Status: AC
  Administered 2022-08-17: 15 mL via OROMUCOSAL

## 2022-08-17 MED ORDER — ACETAMINOPHEN 500 MG PO TABS
1000.0000 mg | ORAL_TABLET | ORAL | Status: AC
  Administered 2022-08-17: 1000 mg via ORAL

## 2022-08-17 MED ORDER — LIDOCAINE HCL (PF) 2 % IJ SOLN
INTRAMUSCULAR | Status: AC
  Filled 2022-08-17: qty 5

## 2022-08-17 MED ORDER — FENTANYL CITRATE (PF) 100 MCG/2ML IJ SOLN
INTRAMUSCULAR | Status: DC | PRN
  Administered 2022-08-17 (×5): 25 ug via INTRAVENOUS

## 2022-08-17 MED ORDER — LACTATED RINGERS IR SOLN
Status: DC | PRN
  Administered 2022-08-17: 3000 mL

## 2022-08-17 MED ORDER — 0.9 % SODIUM CHLORIDE (POUR BTL) OPTIME
TOPICAL | Status: DC | PRN
  Administered 2022-08-17: 150 mL

## 2022-08-17 MED ORDER — CHLORHEXIDINE GLUCONATE 0.12 % MT SOLN
OROMUCOSAL | Status: AC
  Filled 2022-08-17: qty 15

## 2022-08-17 MED ORDER — ACETAMINOPHEN 500 MG PO TABS
ORAL_TABLET | ORAL | Status: AC
  Filled 2022-08-17: qty 2

## 2022-08-17 MED ORDER — BUPIVACAINE HCL (PF) 0.5 % IJ SOLN
INTRAMUSCULAR | Status: DC | PRN
  Administered 2022-08-17: 10 mL

## 2022-08-17 MED ORDER — FAMOTIDINE 20 MG PO TABS: 20.0000 mg | ORAL_TABLET | Freq: Once | ORAL | Status: DC

## 2022-08-17 MED ORDER — GLYCOPYRROLATE 0.2 MG/ML IJ SOLN
INTRAMUSCULAR | Status: DC | PRN
  Administered 2022-08-17: .2 mg via INTRAVENOUS

## 2022-08-17 MED ORDER — ORAL CARE MOUTH RINSE: 15.0000 mL | Freq: Once | OROMUCOSAL | Status: AC

## 2022-08-17 MED ORDER — ONDANSETRON HCL 4 MG/2ML IJ SOLN
INTRAMUSCULAR | Status: DC | PRN
  Administered 2022-08-17: 4 mg via INTRAVENOUS

## 2022-08-17 MED ORDER — LIDOCAINE HCL (CARDIAC) PF 100 MG/5ML IV SOSY
PREFILLED_SYRINGE | INTRAVENOUS | Status: DC | PRN
  Administered 2022-08-17: 100 mg via INTRAVENOUS

## 2022-08-17 MED ORDER — PROPOFOL 10 MG/ML IV BOLUS
INTRAVENOUS | Status: AC
  Filled 2022-08-17: qty 20

## 2022-08-17 MED ORDER — SODIUM CHLORIDE 0.9 % IR SOLN
Status: DC | PRN
  Administered 2022-08-17: 1500 mL

## 2022-08-17 MED ORDER — KETOROLAC TROMETHAMINE 30 MG/ML IJ SOLN
INTRAMUSCULAR | Status: AC
  Filled 2022-08-17: qty 1

## 2022-08-17 MED ORDER — PROPOFOL 10 MG/ML IV BOLUS
INTRAVENOUS | Status: DC | PRN
  Administered 2022-08-17: 200 mg via INTRAVENOUS

## 2022-08-17 MED ORDER — BUPIVACAINE HCL (PF) 0.5 % IJ SOLN
INTRAMUSCULAR | Status: AC
  Filled 2022-08-17: qty 30

## 2022-08-17 MED ORDER — PHENYLEPHRINE HCL (PRESSORS) 10 MG/ML IV SOLN
INTRAVENOUS | Status: DC | PRN
  Administered 2022-08-17 (×2): 80 ug via INTRAVENOUS
  Administered 2022-08-17: 120 ug via INTRAVENOUS
  Administered 2022-08-17: 80 ug via INTRAVENOUS
  Administered 2022-08-17: 120 ug via INTRAVENOUS
  Administered 2022-08-17: 80 ug via INTRAVENOUS

## 2022-08-17 SURGICAL SUPPLY — 28 items
DEVICE MYOSURE REACH (MISCELLANEOUS) IMPLANT
DRSG TELFA 3X8 NADH STRL (GAUZE/BANDAGES/DRESSINGS) IMPLANT
ELECT LOOP 22F BIPOLAR SML (ELECTROSURGICAL) ×1
ELECTRODE LOOP 22F BIPOLAR SML (ELECTROSURGICAL) IMPLANT
GLOVE BIO SURGEON STRL SZ 6.5 (GLOVE) ×1 IMPLANT
GLOVE SURG UNDER LTX SZ7 (GLOVE) ×1 IMPLANT
GOWN STRL REUS W/ TWL LRG LVL3 (GOWN DISPOSABLE) ×2 IMPLANT
GOWN STRL REUS W/TWL LRG LVL3 (GOWN DISPOSABLE) ×2
IV LACTATED RINGER IRRG 3000ML (IV SOLUTION) ×1
IV LR IRRIG 3000ML ARTHROMATIC (IV SOLUTION) ×1 IMPLANT
IV NS 1000ML (IV SOLUTION) ×3
IV NS 1000ML BAXH (IV SOLUTION) IMPLANT
KIT PROCEDURE FLUENT (KITS) ×1 IMPLANT
KIT TURNOVER CYSTO (KITS) ×1 IMPLANT
MANIFOLD NEPTUNE II (INSTRUMENTS) ×1 IMPLANT
NEEDLE HYPO 22GX1.5 SAFETY (NEEDLE) IMPLANT
PACK DNC HYST (MISCELLANEOUS) ×1 IMPLANT
PAD OB MATERNITY 4.3X12.25 (PERSONAL CARE ITEMS) ×1 IMPLANT
PAD PREP 24X41 OB/GYN DISP (PERSONAL CARE ITEMS) ×1 IMPLANT
SCRUB CHG 4% DYNA-HEX 4OZ (MISCELLANEOUS) ×1 IMPLANT
SEAL ROD LENS SCOPE MYOSURE (ABLATOR) IMPLANT
SOL PREP PVP 2OZ (MISCELLANEOUS) ×1
SOLUTION PREP PVP 2OZ (MISCELLANEOUS) ×1 IMPLANT
SURGILUBE 2OZ TUBE FLIPTOP (MISCELLANEOUS) ×1 IMPLANT
SUT VICRYL+ 3-0 36IN CT-1 (SUTURE) IMPLANT
SYR CONTROL 10ML LL (SYRINGE) IMPLANT
TRAP FLUID SMOKE EVACUATOR (MISCELLANEOUS) ×1 IMPLANT
WATER STERILE IRR 500ML POUR (IV SOLUTION) ×1 IMPLANT

## 2022-08-17 NOTE — Interval H&P Note (Signed)
History and Physical Interval Note:  08/17/2022 11:19 AM  Lauren Brooks  has presented today for surgery, with the diagnosis of postmenopausal bleeding, endometrail polyp.  The various methods of treatment have been discussed with the patient and family. After consideration of risks, benefits and other options for treatment, the patient has consented to  Procedure(s):DILATATION AND CURETTAGE /HYSTEROSCOPY (N/A) POLYPECTOMY (N/A) as a surgical intervention.  The patient's history has been reviewed, patient examined, no change in status, stable for surgery.  I have reviewed the patient's chart and labs.  Questions were answered to the patient's satisfaction.     Rubie Maid, MD [facility]

## 2022-08-17 NOTE — Discharge Instructions (Addendum)
Restart Coumadin tomorrow.  AMBULATORY SURGERY  DISCHARGE INSTRUCTIONS   The drugs that you were given will stay in your system until tomorrow so for the next 24 hours you should not:  Drive an automobile Make any legal decisions Drink any alcoholic beverage   You may resume regular meals tomorrow.  Today it is better to start with liquids and gradually work up to solid foods.  You may eat anything you prefer, but it is better to start with liquids, then soup and crackers, and gradually work up to solid foods.   Please notify your doctor immediately if you have any unusual bleeding, trouble breathing, redness and pain at the surgery site, drainage, fever, or pain not relieved by medication.     Your post-operative visit with Dr.                                       is: Date:                        Time:    Please call to schedule your post-operative visit.  Additional Instructions:

## 2022-08-17 NOTE — Anesthesia Procedure Notes (Signed)
Procedure Name: LMA Insertion Date/Time: 08/17/2022 11:59 AM  Performed by: Natasha Mead, CRNAPre-anesthesia Checklist: Patient identified, Emergency Drugs available, Suction available, Patient being monitored and Timeout performed Patient Re-evaluated:Patient Re-evaluated prior to induction Oxygen Delivery Method: Circle system utilized Preoxygenation: Pre-oxygenation with 100% oxygen Induction Type: IV induction Ventilation: Mask ventilation without difficulty LMA: LMA inserted LMA Size: 4.0 Number of attempts: 1

## 2022-08-17 NOTE — Op Note (Signed)
Procedure(s): DILATATION AND CURETTAGE /HYSTEROSCOPY POLYPECTOMY Procedure Note  Lauren Brooks female 76 y.o. 08/17/2022  Indications: The patient is a 76 y.o. [id number] female with history of thickened endometrium, endometrial polyp, postmenopausal bleeding. Also with h/o long-term coumadin use.   Pre-operative Diagnosis: Thickened endometrium, endometrial polyp, postmenopausal bleeding. H/o anticoagulant use long-term.   Post-operative Diagnosis: Same  Surgeon: Rubie Maid, MD  Assistants:  None.   Anesthesia: General endotracheal anesthesia  Findings: The uterus was sounded to 9 cm.  Tubal ostium identified on the left.  Unable to identify on the right.  Large polypoid mass arising from right fundal region. Atrophic endometrium.  Small calcified submucosal fibroid, in left anterior uterine region of uterus.   Procedure Details: The patient was seen in the Holding Room. The risks, benefits, complications, treatment options, and expected outcomes were discussed with the patient.  The patient concurred with the proposed plan, giving informed consent.  The site of surgery properly noted/marked. The patient was taken to the Operating Room, identified as [name] and the procedure verified as Procedure(s) (LRB):DILATATION AND CURETTAGE /HYSTEROSCOPY (N/A), POLYPECTOMY (N/A).   The patient was then placed under general anesthesia without difficulty.  She was then prepped and draped in the normal sterile fashion, and placed in the dorsal lithotomy position.  A time out was performed.  An exam under anesthesia was performed with the findings noted above.  Straight catheterization was performed. A sterile speculum was inserted into vagina. A single-tooth tenaculum was used to grasp the anterior lip of the cervix. Cervical dilation was performed. A 5 mm hysteroscope was introduced into the uterus under direct visualization. The cavity was allowed to fill, and then the  entire cavity was explored with the findings described above. The Myosure device was then inserted into the uterine cavity, activated, and a polypectomy was performed.  After this, there was noted to be bleeding at the base of the polypectomy site.  The Myosure device was removed from the uterine cavity.  Further cervical dilation was performed with Hagar dilators. A Embre resectoscope device was then inserted and utilized to cauterize the bleeding site.  The resectoscope was removed, and a sharp curette was then passed into the uterus and endometrial sampling was collected for pathology.   The tenaculum was then removed from the cervix with active bleeding noted from the tenaculum site and a 1 cm tear from manipulation. A figure-of-eight stitch was placed at the 12 o'clock position for repair.  The cervix was then injected with a total of 10 ml of 0.5% bupivacaine. Good hemostasis was noted.   All instrument and sponge counts were correct at the end of the procedure x 2.  The patient tolerated the procedure well.  She was awakened and taken to the PACU in stable condition.   Estimated Blood Loss:  30      Drains: straight catheterization prior to procedure with  400 ml of clear urine         Total IV Fluids:  1000 ml  Fluid Deficit: 230 ml  Specimens: Endometrial curettings, endometrial polyp         Implants: None         Complications:  None; patient tolerated the procedure well.         Disposition: PACU - hemodynamically stable.         Condition: stable   Rubie Maid, MD [facility]

## 2022-08-17 NOTE — Transfer of Care (Signed)
Immediate Anesthesia Transfer of Care Note  Patient: Lauren Brooks  Procedure(s) Performed: DILATATION AND CURETTAGE /HYSTEROSCOPY POLYPECTOMY  Patient Location: PACU  Anesthesia Type:General  Level of Consciousness: drowsy  Airway & Oxygen Therapy: Patient Spontanous Breathing and Patient connected to face mask oxygen  Post-op Assessment: Report given to RN and Post -op Vital signs reviewed and stable  Post vital signs: stable  Last Vitals:  Vitals Value Taken Time  BP 140/83 08/17/22 1354  Temp    Pulse 84 08/17/22 1359  Resp 15 08/17/22 1359  SpO2 100 % 08/17/22 1359  Vitals shown include unvalidated device data.  Last Pain:  Vitals:   08/17/22 1049  TempSrc: Temporal  PainSc: 0-No pain      Patients Stated Pain Goal: 0 (67/59/16 3846)  Complications: No notable events documented.

## 2022-08-17 NOTE — Anesthesia Preprocedure Evaluation (Addendum)
Anesthesia Evaluation  Patient identified by MRN, date of birth, ID band Patient awake    Reviewed: Allergy & Precautions, NPO status , Patient's Chart, lab work & pertinent test results  History of Anesthesia Complications (+) history of anesthetic complications  Airway Mallampati: I  TM Distance: >3 FB Neck ROM: full    Dental no notable dental hx.    Pulmonary neg pulmonary ROS,    Pulmonary exam normal breath sounds clear to auscultation       Cardiovascular Exercise Tolerance: Good Normal cardiovascular exam+ dysrhythmias (on warfarin ) Atrial Fibrillation  Rhythm:Regular Rate:Normal     Neuro/Psych CVA, No Residual Symptoms negative psych ROS   GI/Hepatic negative GI ROS, Neg liver ROS,   Endo/Other  negative endocrine ROS  Renal/GU negative Renal ROS  negative genitourinary   Musculoskeletal Osteopenia    Abdominal (+) + obese,   Peds  Hematology  (+) Blood dyscrasia, anemia ,   Anesthesia Other Findings Past Medical History: 02/28/2014: Absolute anemia 10/17/2015: Allergic rhinitis 10/17/2015: Arthritis 10/17/2015: Atrial fibrillation ([place])     Comment:  a.) CHA2DSVASc = 5 (age x 2, sex, CVA x2); b.) rate and               rhythm maintained without use of pharmacological               intervention; chronically anticoagulated using warfarin 02/28/2014: Cerebral infarction ([place]) No date: Current use of long term anticoagulation     Comment:  a.) warfarin No date: CVA (cerebral infarction) No date: Fibroid No date: History of kidney stones 02/28/2014: Hypercholesterolemia 08/08/2015: Osteopenia     Comment:  Overview:  per new bone density.   Past Surgical History: No date: BACK SURGERY     Comment:  Bulging Disc, Dr. Mauri Pole No date: COLONOSCOPY 11/23/2018: COLONOSCOPY WITH PROPOFOL; N/A     Comment:  Procedure: COLONOSCOPY WITH PROPOFOL;  Surgeon: [place],               Benay Pike, MD;  Location:  [facility] ENDOSCOPY;  Service:               Gastroenterology;  Laterality: N/A; No date: EYE SURGERY; Bilateral     Comment:  Cataract Extraction No date: LAMINECTOMY 10/28/2015: LAPAROSCOPIC SALPINGO OOPHERECTOMY; Bilateral     Comment:  Procedure: LAPAROSCOPIC SALPINGO OOPHORECTOMY;  Surgeon:              Rubie Maid, MD;  Location: [facility] ORS;  Service:               Gynecology;  Laterality: Bilateral; 2016: OOPHORECTOMY     Comment:  1 ovary removed 03/21/2015: ORIF FEMUR FRACTURE; Left     Comment:  Dr. Roland Rack, [facility] 09/06/2019: TRIGGER FINGER RELEASE; Right     Comment:  Procedure: RELEASE TRIGGER FINGER/A-1 PULLEY;  Surgeon:               Corky Mull, MD;  Location: [facility] ORS;  Service:               Orthopedics;  Laterality: Right;  BMI    Body Mass Index: 35.66 kg/m      Reproductive/Obstetrics negative OB ROS                           Anesthesia Physical Anesthesia Plan  ASA: 2  Anesthesia Plan: General   Post-op Pain Management: Tylenol PO (pre-op)* and Toradol IV (intra-op)*   Induction: Intravenous  PONV Risk Score and Plan: 4 or greater and Ondansetron and Dexamethasone  Airway Management Planned: LMA  Additional Equipment:   Intra-op Plan:   Post-operative Plan: Extubation in OR  Informed Consent: I have reviewed the patients History and Physical, chart, labs and discussed the procedure including the risks, benefits and alternatives for the proposed anesthesia with the patient or authorized representative who has indicated his/her understanding and acceptance.     Dental Advisory Given  Plan Discussed with: Anesthesiologist, CRNA and Surgeon  Anesthesia Plan Comments: (Patient consented for risks of anesthesia including but not limited to:  - adverse reactions to medications - damage to eyes, teeth, lips or other oral mucosa - nerve damage due to positioning  - sore throat or hoarseness - Damage to heart, brain, nerves, lungs,  other parts of body or loss of life  Patient voiced understanding.)        Anesthesia Quick Evaluation

## 2022-08-17 NOTE — Anesthesia Postprocedure Evaluation (Signed)
Anesthesia Post Note  Patient: Bree C Lecuyer  Procedure(s) Performed: DILATATION AND CURETTAGE /HYSTEROSCOPY POLYPECTOMY  Patient location during evaluation: PACU Anesthesia Type: General Level of consciousness: awake and alert Pain management: pain level controlled Vital Signs Assessment: post-procedure vital signs reviewed and stable Respiratory status: spontaneous breathing, nonlabored ventilation, respiratory function stable and patient connected to nasal cannula oxygen Cardiovascular status: blood pressure returned to baseline and stable Postop Assessment: no apparent nausea or vomiting Anesthetic complications: no   No notable events documented.   Last Vitals:  Vitals:   08/17/22 1415 08/17/22 1430  BP: (!) 153/87 (!) 149/85  Pulse: 83 75  Resp: 11 11  Temp:    SpO2: 98% 97%    Last Pain:  Vitals:   08/17/22 1415  TempSrc:   PainSc: 0-No pain                 Ilene Qua

## 2022-08-18 ENCOUNTER — Encounter: Payer: Self-pay | Admitting: Obstetrics and Gynecology

## 2022-08-19 LAB — SURGICAL PATHOLOGY

## 2022-08-25 ENCOUNTER — Encounter: Payer: Self-pay | Admitting: Obstetrics and Gynecology

## 2022-08-25 ENCOUNTER — Ambulatory Visit (INDEPENDENT_AMBULATORY_CARE_PROVIDER_SITE_OTHER): Payer: Medicare PPO | Admitting: Obstetrics and Gynecology

## 2022-08-25 VITALS — BP 144/75 | HR 83 | Resp 16 | Ht 64.0 in | Wt 213.8 lb

## 2022-08-25 DIAGNOSIS — Z9889 Other specified postprocedural states: Secondary | ICD-10-CM

## 2022-08-25 DIAGNOSIS — N840 Polyp of corpus uteri: Secondary | ICD-10-CM

## 2022-08-25 NOTE — Progress Notes (Signed)
    OBSTETRICS/GYNECOLOGY POST-OPERATIVE CLINIC VISIT  Subjective:     Lauren Brooks is a 76 y.o. female who presents to the clinic 2 weeks status post DILATATION AND CURETTAGE /HYSTEROSCOPY  POLYPECTOMY for postmenopausal bleeding, endometrail polyp. Eating a regular diet without difficulty. Bowel movements are normal. The patient is not having any pain. Has only had spotting since restarting her Coumadin but is almost now nothing.   The following portions of the patient's history were reviewed and updated as appropriate: allergies, current medications, past family history, past medical history, past social history, past surgical history, and problem list.  Review of Systems Pertinent items are noted in HPI.   Objective:   BP (!) 144/75   Pulse 83   Resp 16   Ht '5\' 4"'$  (1.626 m)   Wt 213 lb 12.8 oz (97 kg)   BMI 36.70 kg/m  Body mass index is 36.7 kg/m.  General:  alert and no distress  Abdomen: soft, bowel sounds active, non-tender  Pelvis:   deferred    Pathology:  A.  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   B.  UTERUS, ENDOMETRIUM; CURETTAGE:  - BENIGN INACTIVE ENDOMETRIUM WITH TUBAL METAPLASIA.  - NO EVIDENCE OF HYPERPLASIA OR ATYPIA.   Assessment:   Patient s/p DILATATION AND CURETTAGE /HYSTEROSCOPY  POLYPECTOMY (surgery)  Doing well postoperatively.   Plan:   1. Continue any current medications as instructed by provider. 2. Wound care discussed. 3. Operative findings again reviewed. Pathology report discussed. 4. Activity restrictions: none.  5. Anticipated return to work: not applicable. 6. Follow up:  as needed .    Rubie Maid, MD [facility]

## 2022-08-26 ENCOUNTER — Encounter: Payer: Medicare Other | Admitting: Obstetrics and Gynecology

## 2022-10-06 ENCOUNTER — Other Ambulatory Visit: Payer: Self-pay | Admitting: Family Medicine

## 2022-10-06 DIAGNOSIS — Z1231 Encounter for screening mammogram for malignant neoplasm of breast: Secondary | ICD-10-CM

## 2022-10-28 ENCOUNTER — Ambulatory Visit: Payer: Medicare PPO

## 2022-11-17 ENCOUNTER — Ambulatory Visit (LOCAL_COMMUNITY_HEALTH_CENTER): Payer: Medicare PPO

## 2022-11-17 DIAGNOSIS — Z719 Counseling, unspecified: Secondary | ICD-10-CM

## 2022-11-17 DIAGNOSIS — Z23 Encounter for immunization: Secondary | ICD-10-CM

## 2022-11-17 NOTE — Progress Notes (Signed)
  Are you feeling sick today? No   Have you ever received a dose of COVID-19 Vaccine? [employer], [place], [place], [place], Other) Yes  If yes, which vaccine and how many doses?   5 doses Pfizer   Did you bring the vaccination record card or other documentation?  Yes   Do you have a health condition or are undergoing treatment that makes you moderately or severely immunocompromised? This would include, but not be limited to: cancer, HIV, organ transplant, immunosuppressive therapy/high-dose corticosteroids, or moderate/severe primary immunodeficiency.  No  Have you received COVID-19 vaccine before or during hematopoietic cell transplant (HCT) or CAR-T-cell therapies? No  Have you ever had an allergic reaction to: (This would include a severe allergic reaction or a reaction that caused hives, swelling, or respiratory distress, including wheezing.) A component of a COVID-19 vaccine or a previous dose of COVID-19 vaccine? No   Have you ever had an allergic reaction to another vaccine (other thanCOVID-19 vaccine) or an injectable medication? (This would include a severe allergic reaction or a reaction that caused hives, swelling, or respiratory distress, including wheezing.)   No    Do you have a history of any of the following:  Myocarditis or Pericarditis No  Dermal fillers:  No  Multisystem Inflammatory Syndrome (MIS-C or MIS-A)? No  COVID-19 disease within the past 3 months? No  Vaccinated with monkeypox vaccine in the last 4 weeks? No  VIS provided. Comirnaty +12Y given IM in left deltoid. Tolerated well. CVOID card updated. NCIR updated and copy provided. Waited 15 minutes.

## 2023-02-09 ENCOUNTER — Ambulatory Visit
Admission: RE | Admit: 2023-02-09 | Discharge: 2023-02-09 | Disposition: A | Discharge status: Home / Self Care | Payer: Medicare PPO | Source: Ambulatory Visit | Attending: Family Medicine | Admitting: Family Medicine

## 2023-02-09 DIAGNOSIS — Z1231 Encounter for screening mammogram for malignant neoplasm of breast: Secondary | ICD-10-CM | POA: Insufficient documentation

## 2023-02-16 DIAGNOSIS — Z7901 Long term (current) use of anticoagulants: Secondary | ICD-10-CM | POA: Diagnosis not present

## 2023-02-17 DIAGNOSIS — E669 Obesity, unspecified: Secondary | ICD-10-CM | POA: Diagnosis not present

## 2023-02-17 DIAGNOSIS — D6869 Other thrombophilia: Secondary | ICD-10-CM | POA: Diagnosis not present

## 2023-02-17 DIAGNOSIS — J302 Other seasonal allergic rhinitis: Secondary | ICD-10-CM | POA: Diagnosis not present

## 2023-02-17 DIAGNOSIS — Z7901 Long term (current) use of anticoagulants: Secondary | ICD-10-CM | POA: Diagnosis not present

## 2023-02-17 DIAGNOSIS — K5900 Constipation, unspecified: Secondary | ICD-10-CM | POA: Diagnosis not present

## 2023-02-17 DIAGNOSIS — Z8249 Family history of ischemic heart disease and other diseases of the circulatory system: Secondary | ICD-10-CM | POA: Diagnosis not present

## 2023-02-17 DIAGNOSIS — E785 Hyperlipidemia, unspecified: Secondary | ICD-10-CM | POA: Diagnosis not present

## 2023-02-17 DIAGNOSIS — Z823 Family history of stroke: Secondary | ICD-10-CM | POA: Diagnosis not present

## 2023-02-17 DIAGNOSIS — I4891 Unspecified atrial fibrillation: Secondary | ICD-10-CM | POA: Diagnosis not present

## 2023-03-19 DIAGNOSIS — Z7901 Long term (current) use of anticoagulants: Secondary | ICD-10-CM | POA: Diagnosis not present

## 2023-04-19 DIAGNOSIS — Z7901 Long term (current) use of anticoagulants: Secondary | ICD-10-CM | POA: Diagnosis not present

## 2023-05-20 DIAGNOSIS — Z7901 Long term (current) use of anticoagulants: Secondary | ICD-10-CM | POA: Diagnosis not present

## 2023-05-26 DIAGNOSIS — Z6837 Body mass index (BMI) 37.0-37.9, adult: Secondary | ICD-10-CM | POA: Diagnosis not present

## 2023-05-26 DIAGNOSIS — E7800 Pure hypercholesterolemia, unspecified: Secondary | ICD-10-CM | POA: Diagnosis not present

## 2023-05-26 DIAGNOSIS — R7302 Impaired glucose tolerance (oral): Secondary | ICD-10-CM | POA: Diagnosis not present

## 2023-05-26 DIAGNOSIS — I480 Paroxysmal atrial fibrillation: Secondary | ICD-10-CM | POA: Diagnosis not present

## 2023-05-26 DIAGNOSIS — Z7901 Long term (current) use of anticoagulants: Secondary | ICD-10-CM | POA: Diagnosis not present

## 2023-05-26 DIAGNOSIS — Z79899 Other long term (current) drug therapy: Secondary | ICD-10-CM | POA: Diagnosis not present

## 2023-07-05 DIAGNOSIS — Z7901 Long term (current) use of anticoagulants: Secondary | ICD-10-CM | POA: Diagnosis not present

## 2023-07-26 DIAGNOSIS — Z7901 Long term (current) use of anticoagulants: Secondary | ICD-10-CM | POA: Diagnosis not present

## 2023-08-26 DIAGNOSIS — Z7901 Long term (current) use of anticoagulants: Secondary | ICD-10-CM | POA: Diagnosis not present

## 2023-09-27 DIAGNOSIS — Z7901 Long term (current) use of anticoagulants: Secondary | ICD-10-CM | POA: Diagnosis not present

## 2023-09-27 DIAGNOSIS — Z23 Encounter for immunization: Secondary | ICD-10-CM | POA: Diagnosis not present

## 2023-10-11 DIAGNOSIS — Z7901 Long term (current) use of anticoagulants: Secondary | ICD-10-CM | POA: Diagnosis not present

## 2023-11-10 DIAGNOSIS — Z7901 Long term (current) use of anticoagulants: Secondary | ICD-10-CM | POA: Diagnosis not present

## 2023-12-14 DIAGNOSIS — I480 Paroxysmal atrial fibrillation: Secondary | ICD-10-CM | POA: Diagnosis not present

## 2023-12-14 DIAGNOSIS — R7302 Impaired glucose tolerance (oral): Secondary | ICD-10-CM | POA: Diagnosis not present

## 2023-12-14 DIAGNOSIS — E7800 Pure hypercholesterolemia, unspecified: Secondary | ICD-10-CM | POA: Diagnosis not present

## 2023-12-14 DIAGNOSIS — Z1331 Encounter for screening for depression: Secondary | ICD-10-CM | POA: Diagnosis not present

## 2023-12-14 DIAGNOSIS — Z7901 Long term (current) use of anticoagulants: Secondary | ICD-10-CM | POA: Diagnosis not present

## 2023-12-14 DIAGNOSIS — Z1231 Encounter for screening mammogram for malignant neoplasm of breast: Secondary | ICD-10-CM | POA: Diagnosis not present

## 2023-12-14 DIAGNOSIS — Z0000 Encounter for general adult medical examination without abnormal findings: Secondary | ICD-10-CM | POA: Diagnosis not present

## 2023-12-14 DIAGNOSIS — Z79899 Other long term (current) drug therapy: Secondary | ICD-10-CM | POA: Diagnosis not present

## 2023-12-16 ENCOUNTER — Other Ambulatory Visit: Payer: Self-pay | Admitting: Nurse Practitioner

## 2023-12-16 DIAGNOSIS — Z1231 Encounter for screening mammogram for malignant neoplasm of breast: Secondary | ICD-10-CM

## 2024-01-14 DIAGNOSIS — Z7901 Long term (current) use of anticoagulants: Secondary | ICD-10-CM | POA: Diagnosis not present

## 2024-01-18 DIAGNOSIS — H35023 Exudative retinopathy, bilateral: Secondary | ICD-10-CM | POA: Diagnosis not present

## 2024-01-18 DIAGNOSIS — H524 Presbyopia: Secondary | ICD-10-CM | POA: Diagnosis not present

## 2024-02-14 DIAGNOSIS — Z7901 Long term (current) use of anticoagulants: Secondary | ICD-10-CM | POA: Diagnosis not present

## 2024-03-10 DIAGNOSIS — Z7901 Long term (current) use of anticoagulants: Secondary | ICD-10-CM | POA: Diagnosis not present

## 2024-03-10 DIAGNOSIS — M62838 Other muscle spasm: Secondary | ICD-10-CM | POA: Diagnosis not present

## 2024-03-16 DIAGNOSIS — Z7901 Long term (current) use of anticoagulants: Secondary | ICD-10-CM | POA: Diagnosis not present

## 2024-03-23 DIAGNOSIS — R109 Unspecified abdominal pain: Secondary | ICD-10-CM | POA: Diagnosis not present

## 2024-03-23 DIAGNOSIS — M546 Pain in thoracic spine: Secondary | ICD-10-CM | POA: Diagnosis not present

## 2024-03-23 DIAGNOSIS — R9431 Abnormal electrocardiogram [ECG] [EKG]: Secondary | ICD-10-CM | POA: Diagnosis not present

## 2024-03-23 DIAGNOSIS — Z8673 Personal history of transient ischemic attack (TIA), and cerebral infarction without residual deficits: Secondary | ICD-10-CM | POA: Diagnosis not present

## 2024-03-23 DIAGNOSIS — M549 Dorsalgia, unspecified: Secondary | ICD-10-CM | POA: Diagnosis not present

## 2024-03-23 DIAGNOSIS — Z87442 Personal history of urinary calculi: Secondary | ICD-10-CM | POA: Diagnosis not present

## 2024-03-23 DIAGNOSIS — I4891 Unspecified atrial fibrillation: Secondary | ICD-10-CM | POA: Diagnosis not present

## 2024-03-23 DIAGNOSIS — Z7901 Long term (current) use of anticoagulants: Secondary | ICD-10-CM | POA: Diagnosis not present

## 2024-03-23 DIAGNOSIS — R1012 Left upper quadrant pain: Secondary | ICD-10-CM | POA: Diagnosis not present

## 2024-03-24 DIAGNOSIS — R109 Unspecified abdominal pain: Secondary | ICD-10-CM | POA: Diagnosis not present

## 2024-03-24 DIAGNOSIS — Z87442 Personal history of urinary calculi: Secondary | ICD-10-CM | POA: Diagnosis not present

## 2024-04-03 DIAGNOSIS — Z09 Encounter for follow-up examination after completed treatment for conditions other than malignant neoplasm: Secondary | ICD-10-CM | POA: Diagnosis not present

## 2024-04-03 DIAGNOSIS — M549 Dorsalgia, unspecified: Secondary | ICD-10-CM | POA: Diagnosis not present

## 2024-04-03 DIAGNOSIS — I4891 Unspecified atrial fibrillation: Secondary | ICD-10-CM | POA: Diagnosis not present

## 2024-04-17 DIAGNOSIS — Z7901 Long term (current) use of anticoagulants: Secondary | ICD-10-CM | POA: Diagnosis not present

## 2024-05-18 DIAGNOSIS — Z7901 Long term (current) use of anticoagulants: Secondary | ICD-10-CM | POA: Diagnosis not present

## 2024-06-13 DIAGNOSIS — Z79899 Other long term (current) drug therapy: Secondary | ICD-10-CM | POA: Diagnosis not present

## 2024-06-13 DIAGNOSIS — Z1331 Encounter for screening for depression: Secondary | ICD-10-CM | POA: Diagnosis not present

## 2024-06-13 DIAGNOSIS — R7302 Impaired glucose tolerance (oral): Secondary | ICD-10-CM | POA: Diagnosis not present

## 2024-06-13 DIAGNOSIS — E785 Hyperlipidemia, unspecified: Secondary | ICD-10-CM | POA: Diagnosis not present

## 2024-06-13 DIAGNOSIS — Z7901 Long term (current) use of anticoagulants: Secondary | ICD-10-CM | POA: Diagnosis not present

## 2024-06-13 DIAGNOSIS — I480 Paroxysmal atrial fibrillation: Secondary | ICD-10-CM | POA: Diagnosis not present

## 2024-07-12 ENCOUNTER — Ambulatory Visit
Admission: RE | Admit: 2024-07-12 | Discharge: 2024-07-12 | Disposition: A | Discharge status: Home / Self Care | Source: Ambulatory Visit | Attending: Nurse Practitioner | Admitting: Nurse Practitioner

## 2024-07-12 DIAGNOSIS — Z1231 Encounter for screening mammogram for malignant neoplasm of breast: Secondary | ICD-10-CM | POA: Insufficient documentation

## 2024-07-14 DIAGNOSIS — Z7901 Long term (current) use of anticoagulants: Secondary | ICD-10-CM | POA: Diagnosis not present

## 2024-08-14 DIAGNOSIS — Z7901 Long term (current) use of anticoagulants: Secondary | ICD-10-CM | POA: Diagnosis not present

## 2024-09-13 DIAGNOSIS — Z7901 Long term (current) use of anticoagulants: Secondary | ICD-10-CM | POA: Diagnosis not present

## 2024-10-16 DIAGNOSIS — Z7901 Long term (current) use of anticoagulants: Secondary | ICD-10-CM | POA: Diagnosis not present

## 2024-10-30 DIAGNOSIS — Z7901 Long term (current) use of anticoagulants: Secondary | ICD-10-CM | POA: Diagnosis not present

## 2024-10-30 DIAGNOSIS — Z8673 Personal history of transient ischemic attack (TIA), and cerebral infarction without residual deficits: Secondary | ICD-10-CM | POA: Diagnosis not present

## 2024-10-30 DIAGNOSIS — R079 Chest pain, unspecified: Secondary | ICD-10-CM | POA: Diagnosis not present

## 2024-10-30 DIAGNOSIS — I214 Non-ST elevation (NSTEMI) myocardial infarction: Secondary | ICD-10-CM | POA: Diagnosis not present

## 2024-10-30 DIAGNOSIS — I480 Paroxysmal atrial fibrillation: Secondary | ICD-10-CM | POA: Diagnosis not present

## 2024-10-30 DIAGNOSIS — E785 Hyperlipidemia, unspecified: Secondary | ICD-10-CM | POA: Diagnosis not present

## 2024-10-30 DIAGNOSIS — R9431 Abnormal electrocardiogram [ECG] [EKG]: Secondary | ICD-10-CM | POA: Diagnosis not present

## 2024-10-30 DIAGNOSIS — R110 Nausea: Secondary | ICD-10-CM | POA: Diagnosis not present

## 2024-10-30 DIAGNOSIS — I21B Myocardial infarction with coronary microvascular dysfunction: Secondary | ICD-10-CM | POA: Diagnosis not present

## 2024-10-30 DIAGNOSIS — I3139 Other pericardial effusion (noninflammatory): Secondary | ICD-10-CM | POA: Diagnosis not present

## 2024-12-04 ENCOUNTER — Encounter

## 2024-12-04 VITALS — Ht 64.25 in | Wt 211.1 lb

## 2024-12-04 DIAGNOSIS — I214 Non-ST elevation (NSTEMI) myocardial infarction: Secondary | ICD-10-CM | POA: Diagnosis present

## 2024-12-04 NOTE — Progress Notes (Signed)
 Assessment start time: 11:11 AM  Digestive issues/concerns: no known food allergies   24-hours Recall: B: oatmeal raspberries, nuts OR eggs, bacon 2 slices L: salad with chicken OR soup OR hibachi  D: baked or crock pot chicken, broccoli, cabbage, potatoes   Beverages water (40-64oz), sugar free lipton diet tea  Education r/t nutrition plan Patient drinking mostly water. She eats 3 times per day. She says she has been watching her salt intake for years. She tries to make foods at home with less salt. Reviewed mediterranean diet handout. Educated on types of fats, sources, and how to read labels. Brainstormed several meals and snacks with foods she likes and will eat.   Goal 1: Read labels and reduce sodium intake to below 2300mg . Ideally 1500mg  per day.  Goal 2: Reduce saturated fat, less than 12g per day. Replace bad fats for more heart healthy fats.  Goal 3: Eat 15-30gProtein and 30-60gCarbs at each meal.  End time 11:40 AM

## 2024-12-04 NOTE — Progress Notes (Signed)
 Cardiac Individual Treatment Plan  Patient Details  Name: Lauren Brooks MRN: [id number] Date of Birth: [birth year] Referring Provider:   Flowsheet Row Cardiac Rehab from 12/04/2024 in [facility] Cardiac and Pulmonary Rehab  Referring Provider Dr. Margery Ruth, MD    Initial Encounter Date:  Flowsheet Row Cardiac Rehab from 12/04/2024 in [facility] Cardiac and Pulmonary Rehab  Date 12/04/24    Visit Diagnosis: NSTEMI (non-ST elevated myocardial infarction) [facility])  Patient's Home Medications on Admission: Current Medications[1]  Past Medical History: Past Medical History:  Diagnosis Date   Absolute anemia 02/28/2014   Allergic rhinitis 10/17/2015   Arthritis 10/17/2015   Atrial fibrillation (HCC) 10/17/2015   a.) CHA2DSVASc = 5 (age x 2, sex, CVA x2); b.) rate and rhythm maintained without use of pharmacological intervention; chronically anticoagulated using warfarin   Cerebral infarction (HCC) 02/28/2014   Current use of long term anticoagulation    a.) warfarin   CVA (cerebral infarction)    Fibroid    History of kidney stones    Hypercholesterolemia 02/28/2014   Osteopenia 08/08/2015   Overview:  per new bone density.     Tobacco Use: Tobacco Use History[2]  Labs: Review Flowsheet        No data to display           Exercise Target Goals: Exercise Program Goal: Individual exercise prescription set using results from initial 6 min walk test and THRR while considering  patients activity barriers and safety.   Exercise Prescription Goal: Initial exercise prescription builds to 30-45 minutes a day of aerobic activity, 2-3 days per week.  Home exercise guidelines will be given to patient during program as part of exercise prescription that the participant will acknowledge.   Education: Aerobic Exercise: - Group verbal and visual presentation on the components of exercise prescription. Introduces F.I.T.T principle from ACSM for exercise prescriptions.  Reviews F.I.T.T.  principles of aerobic exercise including progression. Written material provided at class time.   Education: Resistance Exercise: - Group verbal and visual presentation on the components of exercise prescription. Introduces F.I.T.T principle from ACSM for exercise prescriptions  Reviews F.I.T.T. principles of resistance exercise including progression. Written material provided at class time.    Education: Exercise & Equipment Safety: - Individual verbal instruction and demonstration of equipment use and safety with use of the equipment.   Education: Exercise Physiology & General Exercise Guidelines: - Group verbal and written instruction with models to review the exercise physiology of the cardiovascular system and associated critical values. Provides general exercise guidelines with specific guidelines to those with heart or lung disease. Written material provided at class time.   Education: Flexibility, Balance, Mind/Body Relaxation: - Group verbal and visual presentation with interactive activity on the components of exercise prescription. Introduces F.I.T.T principle from ACSM for exercise prescriptions. Reviews F.I.T.T. principles of flexibility and balance exercise training including progression. Also discusses the mind body connection.  Reviews various relaxation techniques to help reduce and manage stress (i.e. Deep breathing, progressive muscle relaxation, and visualization). Balance handout provided to take home. Written material provided at class time.   Activity Barriers & Risk Stratification:  Activity Barriers & Cardiac Risk Stratification - 12/04/24 1547       Activity Barriers & Cardiac Risk Stratification   Activity Barriers Arthritis    Cardiac Risk Stratification Moderate          6 Minute Walk:  6 Minute Walk     Row Name 12/04/24 1545  6 Minute Walk   Phase Initial     Distance 880 feet     Walk Time 6 minutes     # of Rest Breaks 0     MPH 1.67      METS 1.66     RPE 13     Perceived Dyspnea  0     VO2 Peak 5.82     Symptoms Yes (comment)     Comments chest heaviness     Resting HR 73 bpm     Resting BP 146/82     Resting Oxygen Saturation  97 %     Exercise Oxygen Saturation  during 6 min walk 96 %     Max Ex. HR 113 bpm     Max Ex. BP 168/88     2 Minute Post BP 148/82        Oxygen Initial Assessment:   Oxygen Re-Evaluation:   Oxygen Discharge (Final Oxygen Re-Evaluation):   Initial Exercise Prescription:  Initial Exercise Prescription - 12/04/24 1500       Date of Initial Exercise RX and Referring Provider   Date 12/04/24    Referring Provider Dr. Margery Ruth, MD      Oxygen   Maintain Oxygen Saturation 88% or higher      Recumbant Bike   Level 1    RPM 50    Watts 10    Minutes 15    METs 1.66      NuStep   Level 2    SPM 80    Minutes 15    METs 1.66      Track   Laps 18    Minutes 15    METs 1.98      Prescription Details   Frequency (times per week) 3    Duration Progress to 30 minutes of continuous aerobic without signs/symptoms of physical distress      Intensity   THRR 40-80% of Max Heartrate 100-128    Ratings of Perceived Exertion 11-13    Perceived Dyspnea 0-4      Progression   Progression Continue to progress workloads to maintain intensity without signs/symptoms of physical distress.      Resistance Training   Training Prescription Yes    Weight 3 lb    Reps 10-15          Perform Capillary Blood Glucose checks as needed.  Exercise Prescription Changes:   Exercise Prescription Changes     Row Name 12/04/24 1500             Response to Exercise   Blood Pressure (Admit) 146/82       Blood Pressure (Exercise) 168/88       Blood Pressure (Exit) 148/82       Heart Rate (Admit) 73 bpm       Heart Rate (Exercise) 113 bpm       Heart Rate (Exit) 87 bpm       Oxygen Saturation (Admit) 97 %       Oxygen Saturation (Exercise) 96 %       Rating of Perceived  Exertion (Exercise) 13       Perceived Dyspnea (Exercise) 0       Symptoms chest heaviness       Comments Results          Exercise Comments:   Exercise Goals and Review:   Exercise Goals     Row Name 12/04/24 1547  Exercise Goals   Increase Physical Activity Yes       Intervention Provide advice, education, support and counseling about physical activity/exercise needs.;Develop an individualized exercise prescription for aerobic and resistive training based on initial evaluation findings, risk stratification, comorbidities and participant's personal goals.       Expected Outcomes Short Term: Attend rehab on a regular basis to increase amount of physical activity.;Long Term: Add in home exercise to make exercise part of routine and to increase amount of physical activity.;Long Term: Exercising regularly at least 3-5 days a week.       Increase Strength and Stamina Yes       Intervention Provide advice, education, support and counseling about physical activity/exercise needs.;Develop an individualized exercise prescription for aerobic and resistive training based on initial evaluation findings, risk stratification, comorbidities and participant's personal goals.       Expected Outcomes Short Term: Increase workloads from initial exercise prescription for resistance, speed, and METs.;Long Term: Improve cardiorespiratory fitness, muscular endurance and strength as measured by increased METs and functional capacity ( );Short Term: Perform resistance training exercises routinely during rehab and add in resistance training at home       Able to understand and use rate of perceived exertion (RPE) scale Yes       Intervention Provide education and explanation on how to use RPE scale       Expected Outcomes Short Term: Able to use RPE daily in rehab to express subjective intensity level;Long Term:  Able to use RPE to guide intensity level when exercising independently        Able to understand and use Dyspnea scale Yes       Intervention Provide education and explanation on how to use Dyspnea scale       Expected Outcomes Short Term: Able to use Dyspnea scale daily in rehab to express subjective sense of shortness of breath during exertion;Long Term: Able to use Dyspnea scale to guide intensity level when exercising independently       Knowledge and understanding of Target Heart Rate Range (THRR) Yes       Intervention Provide education and explanation of THRR including how the numbers were predicted and where they are located for reference       Expected Outcomes Short Term: Able to state/look up THRR;Long Term: Able to use THRR to govern intensity when exercising independently;Short Term: Able to use daily as guideline for intensity in rehab       Able to check pulse independently Yes       Intervention Provide education and demonstration on how to check pulse in carotid and radial arteries.;Review the importance of being able to check your own pulse for safety during independent exercise       Expected Outcomes Short Term: Able to explain why pulse checking is important during independent exercise;Long Term: Able to check pulse independently and accurately       Understanding of Exercise Prescription Yes       Intervention Provide education, explanation, and written materials on patient's individual exercise prescription       Expected Outcomes Short Term: Able to explain program exercise prescription;Long Term: Able to explain home exercise prescription to exercise independently          Exercise Goals Re-Evaluation :   Discharge Exercise Prescription (Final Exercise Prescription Changes):  Exercise Prescription Changes - 12/04/24 1500       Response to Exercise   Blood Pressure (Admit) 146/82    Blood Pressure (  Exercise) 168/88    Blood Pressure (Exit) 148/82    Heart Rate (Admit) 73 bpm    Heart Rate (Exercise) 113 bpm    Heart Rate (Exit) 87 bpm     Oxygen Saturation (Admit) 97 %    Oxygen Saturation (Exercise) 96 %    Rating of Perceived Exertion (Exercise) 13    Perceived Dyspnea (Exercise) 0    Symptoms chest heaviness    Comments Results          Nutrition:  Target Goals: Understanding of nutrition guidelines, daily intake of sodium 1500mg , cholesterol 200mg , calories 30% from fat and 7% or less from saturated fats, daily to have 5 or more servings of fruits and vegetables.  Education: Nutrition 1 -Group instruction provided by verbal, written material, interactive activities, discussions, models, and posters to present general guidelines for heart healthy nutrition including macronutrients, label reading, and promoting whole foods over processed counterparts. Education serves as pensions consultant of discussion of heart healthy eating for all. Written material provided at class time.    Education: Nutrition 2 -Group instruction provided by verbal, written material, interactive activities, discussions, models, and posters to present general guidelines for heart healthy nutrition including sodium, cholesterol, and saturated fat. Providing guidance of habit forming to improve blood pressure, cholesterol, and body weight. Written material provided at class time.     Biometrics:  Pre Biometrics - 12/04/24 1547       Pre Biometrics   Height 5' 4.25 (1.632 m)    Weight 211 lb 1.6 oz (95.8 kg)    Waist Circumference 44.5 inches    Hip Circumference 47 inches    Waist to Hip Ratio 0.95 %    BMI (Calculated) 35.95    Single Leg Stand 7.3 seconds           Nutrition Therapy Plan and Nutrition Goals:  Nutrition Therapy & Goals - 12/04/24 1150       Nutrition Therapy   Diet Cardiac, low Na    Protein (specify units) 70-90    Fiber 25 grams    Whole Grain Foods 3 servings    Saturated Fats 15 max. grams    Fruits and Vegetables 5 servings/day    Sodium 2 grams      Personal Nutrition Goals   Nutrition Goal Read  labels and reduce sodium intake to below 2300mg . Ideally 1500mg  per day.    Personal Goal #2 Reduce saturated fat, less than 12g per day. Replace bad fats for more heart healthy fats.    Personal Goal #3 Eat 15-30gProtein and 30-60gCarbs at each meal.    Comments Patient drinking mostly water. She eats 3 times per day. She says she has been watching her salt intake for years. She tries to make foods at home with less salt. Reviewed mediterranean diet handout. Educated on types of fats, sources, and how to read labels. Brainstormed several meals and snacks with foods she likes and will eat.      Intervention Plan   Intervention Prescribe, educate and counsel regarding individualized specific dietary modifications aiming towards targeted core components such as weight, hypertension, lipid management, diabetes, heart failure and other comorbidities.;Nutrition handout(s) given to patient.    Expected Outcomes Short Term Goal: Understand basic principles of dietary content, such as calories, fat, sodium, cholesterol and nutrients.;Short Term Goal: A plan has been developed with personal nutrition goals set during dietitian appointment.;Long Term Goal: Adherence to prescribed nutrition plan.  Nutrition Assessments:  MEDIFICTS Score Key: >=70 Need to make dietary changes  40-70 Heart Healthy Diet <= 40 Therapeutic Level Cholesterol Diet  Flowsheet Row Cardiac Rehab from 12/04/2024 in [facility] Cardiac and Pulmonary Rehab  Picture Your Plate Total Score on Admission 76   Picture Your Plate Scores: <59 Unhealthy dietary pattern with much room for improvement. 41-50 Dietary pattern unlikely to meet recommendations for good health and room for improvement. 51-60 More healthful dietary pattern, with some room for improvement.  >60 Healthy dietary pattern, although there may be some specific behaviors that could be improved.    Nutrition Goals Re-Evaluation:   Nutrition Goals Discharge (Final  Nutrition Goals Re-Evaluation):   Psychosocial: Target Goals: Acknowledge presence or absence of significant depression and/or stress, maximize coping skills, provide positive support system. Participant is able to verbalize types and ability to use techniques and skills needed for reducing stress and depression.   Education: Stress, Anxiety, and Depression - Group verbal and visual presentation to define topics covered.  Reviews how body is impacted by stress, anxiety, and depression.  Also discusses healthy ways to reduce stress and to treat/manage anxiety and depression. Written material provided at class time.   Education: Sleep Hygiene -Provides group verbal and written instruction about how sleep can affect your health.  Define sleep hygiene, discuss sleep cycles and impact of sleep habits. Review good sleep hygiene tips.   Initial Review & Psychosocial Screening:  Initial Psych Review & Screening - 12/04/24 1311       Initial Review   Current issues with Current Stress Concerns      Family Dynamics   Good Support System? Yes      Barriers   Psychosocial barriers to participate in program There are no identifiable barriers or psychosocial needs.;The patient should benefit from training in stress management and relaxation.      Screening Interventions   Interventions Encouraged to exercise;Provide feedback about the scores to participant;To provide support and resources with identified psychosocial needs    Expected Outcomes Short Term goal: Utilizing psychosocial counselor, staff and physician to assist with identification of specific Stressors or current issues interfering with healing process. Setting desired goal for each stressor or current issue identified.;Long Term Goal: Stressors or current issues are controlled or eliminated.;Short Term goal: Identification and review with participant of any Quality of Life or Depression concerns found by scoring the questionnaire.;Long  Term goal: The participant improves quality of Life and PHQ9 Scores as seen by post scores and/or verbalization of changes          Quality of Life Scores:   Quality of Life - 12/04/24 1319       Quality of Life   Select Quality of Life      Quality of Life Scores   Health/Function Pre 27 %    Socioeconomic Pre 27.43 %    Psych/Spiritual Pre 24.86 %    Family Pre 27 %    GLOBAL Pre 26.6 %         Scores of 19 and below usually indicate a poorer quality of life in these areas.  A difference of  2-3 points is a clinically meaningful difference.  A difference of 2-3 points in the total score of the Quality of Life Index has been associated with significant improvement in overall quality of life, self-image, physical symptoms, and general health in studies assessing change in quality of life.  PHQ-9: Review Flowsheet       12/04/2024  Depression  screen PHQ 2/9  Decreased Interest 3  Down, Depressed, Hopeless 0  PHQ - 2 Score 3  Altered sleeping 0  Tired, decreased energy 1  Change in appetite 0  Feeling bad or failure about yourself  1  Trouble concentrating 0  Moving slowly or fidgety/restless 0  Suicidal thoughts 0  PHQ-9 Score 5  Difficult doing work/chores Not difficult at all   Interpretation of Total Score  Total Score Depression Severity:  1-4 = Minimal depression, 5-9 = Mild depression, 10-14 = Moderate depression, 15-19 = Moderately severe depression, 20-27 = Severe depression   Psychosocial Evaluation and Intervention:  Psychosocial Evaluation - 12/04/24 1311       Psychosocial Evaluation & Interventions   Interventions Relaxation education;Encouraged to exercise with the program and follow exercise prescription    Comments Ms. Jayson is coming to cardiac rehab post NSTEMI. She states she is feeling better since her hospitalization, but finds herself more tired than usual. She is contributing it to medication and busy season of life, but it does  have her worried some days. Encouraged her to talk to her doctor about concerns with tiredness. 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 She enjoys staying busy at her job. She has been trying to eat smaller, frequent meals which has helped with her stamina in the morning. She has a support system in her family. She wants to come to the program to work on stamina and strength    Expected Outcomes Short: attend cardiac rehab for education and exercise Long: Develop and maintain positive self care habits.    Continue Psychosocial Services  Follow up required by staff          Psychosocial Re-Evaluation:   Psychosocial Discharge (Final Psychosocial Re-Evaluation):   Vocational Rehabilitation: Provide vocational rehab assistance to qualifying candidates.   Vocational Rehab Evaluation & Intervention:  Vocational Rehab - 12/04/24 1311       Initial Vocational Rehab Evaluation & Intervention   Assessment shows need for Vocational Rehabilitation No          Education: Education Goals: Education classes will be provided on a variety of topics geared toward better understanding of heart health and risk factor modification. Participant will state understanding/return demonstration of topics presented as noted by education test scores.  Learning Barriers/Preferences:  Learning Barriers/Preferences - 12/04/24 1044       Learning Barriers/Preferences   Learning Barriers None    Learning Preferences Individual Instruction          General Cardiac Education Topics:  AED/CPR: - Group verbal and written instruction with the use of models to demonstrate the basic use of the AED with the basic ABC's of resuscitation.   Test and Procedures: - Group verbal and visual presentation and models provide information about basic cardiac anatomy and function. Reviews the testing methods done to  diagnose heart disease and the outcomes of the test results. Describes the treatment choices: Medical Management, Angioplasty, or Coronary Bypass Surgery for treating various heart conditions including Myocardial Infarction, Angina, Valve Disease, and Cardiac Arrhythmias. Written material provided at class time.   Medication Safety: - Group verbal and visual instruction to review commonly prescribed medications for heart and lung disease. Reviews the medication, class of the drug, and side effects. Includes the steps to properly store meds and maintain the prescription regimen. Written material provided at class time.  Intimacy: - Group verbal instruction through game format to discuss how heart and lung disease can affect sexual intimacy. Written material provided at class time.   Know Your Numbers and Heart Failure: - Group verbal and visual instruction to discuss disease risk factors for cardiac and pulmonary disease and treatment options.  Reviews associated critical values for Overweight/Obesity, Hypertension, Cholesterol, and Diabetes.  Discusses basics of heart failure: signs/symptoms and treatments.  Introduces Heart Failure Zone chart for action plan for heart failure. Written material provided at class time.   Infection Prevention: - Provides verbal and written material to individual with discussion of infection control including proper hand washing and proper equipment cleaning during exercise session.   Falls Prevention: - Provides verbal and written material to individual with discussion of falls prevention and safety.   Other: -Provides group and verbal instruction on various topics (see comments)   Knowledge Questionnaire Score:  Knowledge Questionnaire Score - 12/04/24 1328       Knowledge Questionnaire Score   Pre Score 22/26          Core Components/Risk Factors/Patient Goals at Admission:  Personal Goals and Risk Factors at Admission - 12/04/24 1040        Core Components/Risk Factors/Patient Goals on Admission    Weight Management Yes;Weight Loss    Intervention Weight Management: Develop a combined nutrition and exercise program designed to reach desired caloric intake, while maintaining appropriate intake of nutrient and fiber, sodium and fats, and appropriate energy expenditure required for the weight goal.;Weight Management: Provide education and appropriate resources to help participant work on and attain dietary goals.;Weight Management/Obesity: Establish reasonable short term and long term weight goals.;Obesity: Provide education and appropriate resources to help participant work on and attain dietary goals.    Goal Weight: Long Term 190 lb (86.2 kg)    Expected Outcomes Short Term: Continue to assess and modify interventions until short term weight is achieved;Long Term: Adherence to nutrition and physical activity/exercise program aimed toward attainment of established weight goal;Weight Loss: Understanding of general recommendations for a balanced deficit meal plan, which promotes 1-2 lb weight loss per week and includes a negative energy balance of [phone] kcal/d;Understanding recommendations for meals to include 15-35% energy as protein, 25-35% energy from fat, 35-60% energy from carbohydrates, less than 200mg  of dietary cholesterol, 20-35 gm of total fiber daily;Understanding of distribution of calorie intake throughout the day with the consumption of 4-5 meals/snacks    Hypertension Yes    Intervention Provide education on lifestyle modifcations including regular physical activity/exercise, weight management, moderate sodium restriction and increased consumption of fresh fruit, vegetables, and low fat dairy, alcohol moderation, and smoking cessation.;Monitor prescription use compliance.    Expected Outcomes Short Term: Continued assessment and intervention until BP is < 140/49mm HG in hypertensive participants. < 130/31mm HG in hypertensive  participants with diabetes, heart failure or chronic kidney disease.;Long Term: Maintenance of blood pressure at goal levels.    Lipids Yes    Intervention Provide education and support for participant on nutrition & aerobic/resistive exercise along with prescribed medications to achieve LDL 70mg , HDL >40mg .    Expected Outcomes Short Term: Participant states understanding of desired cholesterol values and is compliant with medications prescribed. Participant is following exercise prescription and nutrition guidelines.;Long Term: Cholesterol controlled with medications as prescribed, with individualized exercise RX and with personalized nutrition plan. Value goals: LDL < 70mg , HDL > 40 mg.          Education:Diabetes - Individual verbal and written instruction  to review signs/symptoms of diabetes, desired ranges of glucose level fasting, after meals and with exercise. Acknowledge that pre and post exercise glucose checks will be done for 3 sessions at entry of program.   Core Components/Risk Factors/Patient Goals Review:    Core Components/Risk Factors/Patient Goals at Discharge (Final Review):    ITP Comments:  ITP Comments     Row Name 12/04/24 1158           ITP Comments Completed program orientation and . Initial ITP created and sent for review to Medical Director.          Comments: Initial ITP    [1]  Current Outpatient Medications:    atorvastatin (LIPITOR) 40 MG tablet, Take 40 mg by mouth daily., Disp: , Rfl:    calcium carbonate (OSCAL) 1500 (600 Ca) MG TABS tablet, Take 600 mg by mouth 2 (two) times daily., Disp: , Rfl:    cetirizine (ZYRTEC) 10 MG tablet, Take 10 mg by mouth daily., Disp: , Rfl:    diclofenac Sodium (VOLTAREN) 1 % GEL, Apply 2 g topically., Disp: , Rfl:    metoprolol succinate (TOPROL-XL) 25 MG 24 hr tablet, Take 50 mg by mouth., Disp: , Rfl:    Multiple Vitamin (MULTIVITAMIN WITH MINERALS) TABS tablet, Take 1 tablet by mouth daily., Disp: ,  Rfl:    polyethylene glycol (MIRALAX / GLYCOLAX) 17 g packet, Take 17 g by mouth daily as needed., Disp: , Rfl:    senna (SENOKOT) 8.6 MG TABS tablet, Take 1 tablet by mouth daily., Disp: , Rfl:    warfarin (COUMADIN) 6 MG tablet, Take 6 mg by mouth every evening. Take 1 tablet (6 mg) by mouth with a 1 mg tablet on Mondays, Tuesdays, Wednesdays, Thursdays, & Fridays for total daily dose=7 mg Take 1 tablet (6mg ) by mouth on Sundays & Saturday for total daily dose=6 mg, Disp: , Rfl:    Cholecalciferol (VITAMIN D3) 50 MCG (2000 UT) TABS, Take 2,000 Units by mouth 2 (two) times daily., Disp: , Rfl:    fluticasone (FLONASE) 50 MCG/ACT nasal spray, Place 1 spray into both nostrils 2 (two) times daily. , Disp: , Rfl:    SYSTANE 0.4-0.3 % SOLN, Place 1 drop into both eyes 2 (two) times daily., Disp: , Rfl:    warfarin (COUMADIN) 1 MG tablet, Take 1 mg by mouth every Monday, Tuesday, Wednesday, Thursday, and Friday. In the evening., Disp: , Rfl:  [2]  Social History Tobacco Use  Smoking Status Never  Smokeless Tobacco Never

## 2024-12-04 NOTE — Patient Instructions (Signed)
 Patient Instructions  Patient Details  Name: Lauren Brooks MRN: [id number] Date of Birth: [birth year] Referring Provider:  Jama Margery ORN, MD  Below are your personal goals for exercise, nutrition, and risk factors. Our goal is to help you stay on track towards obtaining and maintaining these goals. We will be discussing your progress on these goals with you throughout the program.  Initial Exercise Prescription:  Initial Exercise Prescription - 12/04/24 1500       Date of Initial Exercise RX and Referring Provider   Date 12/04/24    Referring Provider Dr. Margery Jama, MD      Oxygen   Maintain Oxygen Saturation 88% or higher      Recumbant Bike   Level 1    RPM 50    Watts 10    Minutes 15    METs 1.66      NuStep   Level 2    SPM 80    Minutes 15    METs 1.66      Track   Laps 18    Minutes 15    METs 1.98      Prescription Details   Frequency (times per week) 3    Duration Progress to 30 minutes of continuous aerobic without signs/symptoms of physical distress      Intensity   THRR 40-80% of Max Heartrate 100-128    Ratings of Perceived Exertion 11-13    Perceived Dyspnea 0-4      Progression   Progression Continue to progress workloads to maintain intensity without signs/symptoms of physical distress.      Resistance Training   Training Prescription Yes    Weight 3 lb    Reps 10-15          Exercise Goals: Frequency: Be able to perform aerobic exercise two to three times per week in program working toward 2-5 days per week of home exercise.  Intensity: Work with a perceived exertion of 11 (fairly light) - 15 (hard) while following your exercise prescription.  We will make changes to your prescription with you as you progress through the program.   Duration: Be able to do 30 to 45 minutes of continuous aerobic exercise in addition to a 5 minute warm-up and a 5 minute cool-down routine.   Nutrition Goals: Your personal nutrition goals will be  established when you do your nutrition analysis with the dietician.  The following are general nutrition guidelines to follow: Cholesterol < 200mg /day Sodium < 1500mg /day Fiber: Women over 50 yrs - 21 grams per day  Personal Goals:  Personal Goals and Risk Factors at Admission - 12/04/24 1040       Core Components/Risk Factors/Patient Goals on Admission    Weight Management Yes;Weight Loss    Intervention Weight Management: Develop a combined nutrition and exercise program designed to reach desired caloric intake, while maintaining appropriate intake of nutrient and fiber, sodium and fats, and appropriate energy expenditure required for the weight goal.;Weight Management: Provide education and appropriate resources to help participant work on and attain dietary goals.;Weight Management/Obesity: Establish reasonable short term and long term weight goals.;Obesity: Provide education and appropriate resources to help participant work on and attain dietary goals.    Goal Weight: Long Term 190 lb (86.2 kg)    Expected Outcomes Short Term: Continue to assess and modify interventions until short term weight is achieved;Long Term: Adherence to nutrition and physical activity/exercise program aimed toward attainment of established weight goal;Weight Loss: Understanding of general recommendations for  a balanced deficit meal plan, which promotes 1-2 lb weight loss per week and includes a negative energy balance of [phone] kcal/d;Understanding recommendations for meals to include 15-35% energy as protein, 25-35% energy from fat, 35-60% energy from carbohydrates, less than 200mg  of dietary cholesterol, 20-35 gm of total fiber daily;Understanding of distribution of calorie intake throughout the day with the consumption of 4-5 meals/snacks    Hypertension Yes    Intervention Provide education on lifestyle modifcations including regular physical activity/exercise, weight management, moderate sodium restriction and  increased consumption of fresh fruit, vegetables, and low fat dairy, alcohol moderation, and smoking cessation.;Monitor prescription use compliance.    Expected Outcomes Short Term: Continued assessment and intervention until BP is < 140/5mm HG in hypertensive participants. < 130/81mm HG in hypertensive participants with diabetes, heart failure or chronic kidney disease.;Long Term: Maintenance of blood pressure at goal levels.    Lipids Yes    Intervention Provide education and support for participant on nutrition & aerobic/resistive exercise along with prescribed medications to achieve LDL 70mg , HDL >40mg .    Expected Outcomes Short Term: Participant states understanding of desired cholesterol values and is compliant with medications prescribed. Participant is following exercise prescription and nutrition guidelines.;Long Term: Cholesterol controlled with medications as prescribed, with individualized exercise RX and with personalized nutrition plan. Value goals: LDL < 70mg , HDL > 40 mg.         Exercise Goals and Review:  Exercise Goals     Row Name 12/04/24 1547             Exercise Goals   Increase Physical Activity Yes       Intervention Provide advice, education, support and counseling about physical activity/exercise needs.;Develop an individualized exercise prescription for aerobic and resistive training based on initial evaluation findings, risk stratification, comorbidities and participant's personal goals.       Expected Outcomes Short Term: Attend rehab on a regular basis to increase amount of physical activity.;Long Term: Add in home exercise to make exercise part of routine and to increase amount of physical activity.;Long Term: Exercising regularly at least 3-5 days a week.       Increase Strength and Stamina Yes       Intervention Provide advice, education, support and counseling about physical activity/exercise needs.;Develop an individualized exercise prescription for  aerobic and resistive training based on initial evaluation findings, risk stratification, comorbidities and participant's personal goals.       Expected Outcomes Short Term: Increase workloads from initial exercise prescription for resistance, speed, and METs.;Long Term: Improve cardiorespiratory fitness, muscular endurance and strength as measured by increased METs and functional capacity ( );Short Term: Perform resistance training exercises routinely during rehab and add in resistance training at home       Able to understand and use rate of perceived exertion (RPE) scale Yes       Intervention Provide education and explanation on how to use RPE scale       Expected Outcomes Short Term: Able to use RPE daily in rehab to express subjective intensity level;Long Term:  Able to use RPE to guide intensity level when exercising independently       Able to understand and use Dyspnea scale Yes       Intervention Provide education and explanation on how to use Dyspnea scale       Expected Outcomes Short Term: Able to use Dyspnea scale daily in rehab to express subjective sense of shortness of breath during exertion;Long Term: Able to use Dyspnea  scale to guide intensity level when exercising independently       Knowledge and understanding of Target Heart Rate Range (THRR) Yes       Intervention Provide education and explanation of THRR including how the numbers were predicted and where they are located for reference       Expected Outcomes Short Term: Able to state/look up THRR;Long Term: Able to use THRR to govern intensity when exercising independently;Short Term: Able to use daily as guideline for intensity in rehab       Able to check pulse independently Yes       Intervention Provide education and demonstration on how to check pulse in carotid and radial arteries.;Review the importance of being able to check your own pulse for safety during independent exercise       Expected Outcomes Short Term: Able  to explain why pulse checking is important during independent exercise;Long Term: Able to check pulse independently and accurately       Understanding of Exercise Prescription Yes       Intervention Provide education, explanation, and written materials on patient's individual exercise prescription       Expected Outcomes Short Term: Able to explain program exercise prescription;Long Term: Able to explain home exercise prescription to exercise independently

## 2024-12-18 ENCOUNTER — Encounter: Attending: Cardiology

## 2024-12-18 DIAGNOSIS — I252 Old myocardial infarction: Secondary | ICD-10-CM | POA: Insufficient documentation

## 2024-12-18 DIAGNOSIS — Z5189 Encounter for other specified aftercare: Secondary | ICD-10-CM | POA: Diagnosis present

## 2024-12-18 DIAGNOSIS — I214 Non-ST elevation (NSTEMI) myocardial infarction: Secondary | ICD-10-CM

## 2024-12-18 NOTE — Progress Notes (Signed)
 Daily Session Note  Patient Details  Name: Lauren Brooks MRN: [id number] Date of Birth: 04-11-1946 Referring Provider:   Flowsheet Row Cardiac Rehab from 12/04/2024 in [facility]  Referring Provider Dr. Margery Ruth, MD    Encounter Date: 12/18/2024  Check In:  Session Check In - 12/18/24 1115       Check-In   Supervising physician immediately available to respond to emergencies See telemetry face sheet for immediately available ER MD    Location [facility]-Cardiac & Pulmonary Rehab    Staff Present Burnard Davenport RN,BSN,MPA;Joseph Rolinda RCP,RRT,BSRT;Laura Cates RN,BSN;Brick Ketcher Dyane BS, ACSM CEP, Exercise Physiologist    Virtual Visit No    Medication changes reported     No    Fall or balance concerns reported    No    Warm-up and Cool-down Performed on first and last piece of equipment    Resistance Training Performed Yes    VAD Patient? No    PAD/SET Patient? No      Pain Assessment   Currently in Pain? No/denies             Tobacco Use History[1]  Goals Met:  Independence with exercise equipment Exercise tolerated well No report of concerns or symptoms today Strength training completed today  Goals Unmet:  Not Applicable  Comments: First full day of exercise!  Patient was oriented to gym and equipment including functions, settings, policies, and procedures.  Patient's individual exercise prescription and treatment plan were reviewed.  All starting workloads were established based on the results of the 6 minute walk test done at initial orientation visit.  The plan for exercise progression was also introduced and progression will be customized based on patient's performance and goals.    Dr. Oneil Pinal is Medical Director for [facility].  Dr. Fuad Aleskerov is Medical Director for [facility] Pulmonary Rehabilitation.    [1]  Social History Tobacco Use  Smoking Status Never  Smokeless Tobacco Never

## 2024-12-20 ENCOUNTER — Encounter: Admitting: Emergency Medicine

## 2024-12-20 DIAGNOSIS — Z5189 Encounter for other specified aftercare: Secondary | ICD-10-CM | POA: Diagnosis not present

## 2024-12-20 DIAGNOSIS — I214 Non-ST elevation (NSTEMI) myocardial infarction: Secondary | ICD-10-CM

## 2024-12-20 NOTE — Progress Notes (Signed)
 Cardiac Individual Treatment Plan  Patient Details  Name: Lauren Brooks MRN: [id number] Date of Birth: 06-Sep-1946 Referring Provider:   Flowsheet Row Cardiac Rehab from 12/04/2024 in [facility] Cardiac and Pulmonary Rehab  Referring Provider Dr. Margery Ruth, MD    Initial Encounter Date:  Flowsheet Row Cardiac Rehab from 12/04/2024 in [facility] Cardiac and Pulmonary Rehab  Date 12/04/24    Visit Diagnosis: NSTEMI (non-ST elevated myocardial infarction) [facility])  Patient's Home Medications on Admission: Current Medications[1]  Past Medical History: Past Medical History:  Diagnosis Date   Absolute anemia 02/28/2014   Allergic rhinitis 10/17/2015   Arthritis 10/17/2015   Atrial fibrillation (HCC) 10/17/2015   a.) CHA2DSVASc = 5 (age x 2, sex, CVA x2); b.) rate and rhythm maintained without use of pharmacological intervention; chronically anticoagulated using warfarin   Cerebral infarction (HCC) 02/28/2014   Current use of long term anticoagulation    a.) warfarin   CVA (cerebral infarction)    Fibroid    History of kidney stones    Hypercholesterolemia 02/28/2014   Osteopenia 08/08/2015   Overview:  per new bone density.     Tobacco Use: Tobacco Use History[2]  Labs: Review Flowsheet        No data to display           Exercise Target Goals: Exercise Program Goal: Individual exercise prescription set using results from initial 6 min walk test and THRR while considering  patients activity barriers and safety.   Exercise Prescription Goal: Initial exercise prescription builds to 30-45 minutes a day of aerobic activity, 2-3 days per week.  Home exercise guidelines will be given to patient during program as part of exercise prescription that the participant will acknowledge.   Education: Aerobic Exercise: - Group verbal and visual presentation on the components of exercise prescription. Introduces F.I.T.T principle from ACSM for exercise prescriptions.  Reviews F.I.T.T.  principles of aerobic exercise including progression. Written material provided at class time.   Education: Resistance Exercise: - Group verbal and visual presentation on the components of exercise prescription. Introduces F.I.T.T principle from ACSM for exercise prescriptions  Reviews F.I.T.T. principles of resistance exercise including progression. Written material provided at class time.    Education: Exercise & Equipment Safety: - Individual verbal instruction and demonstration of equipment use and safety with use of the equipment.   Education: Exercise Physiology & General Exercise Guidelines: - Group verbal and written instruction with models to review the exercise physiology of the cardiovascular system and associated critical values. Provides general exercise guidelines with specific guidelines to those with heart or lung disease. Written material provided at class time.   Education: Flexibility, Balance, Mind/Body Relaxation: - Group verbal and visual presentation with interactive activity on the components of exercise prescription. Introduces F.I.T.T principle from ACSM for exercise prescriptions. Reviews F.I.T.T. principles of flexibility and balance exercise training including progression. Also discusses the mind body connection.  Reviews various relaxation techniques to help reduce and manage stress (i.e. Deep breathing, progressive muscle relaxation, and visualization). Balance handout provided to take home. Written material provided at class time.   Activity Barriers & Risk Stratification:  Activity Barriers & Cardiac Risk Stratification - 12/04/24 1547       Activity Barriers & Cardiac Risk Stratification   Activity Barriers Arthritis    Cardiac Risk Stratification Moderate          6 Minute Walk:  6 Minute Walk     Row Name 12/04/24 1545  6 Minute Walk   Phase Initial     Distance 880 feet     Walk Time 6 minutes     # of Rest Breaks 0     MPH 1.67      METS 1.66     RPE 13     Perceived Dyspnea  0     VO2 Peak 5.82     Symptoms Yes (comment)     Comments chest heaviness     Resting HR 73 bpm     Resting BP 146/82     Resting Oxygen Saturation  97 %     Exercise Oxygen Saturation  during 6 min walk 96 %     Max Ex. HR 113 bpm     Max Ex. BP 168/88     2 Minute Post BP 148/82        Oxygen Initial Assessment:   Oxygen Re-Evaluation:   Oxygen Discharge (Final Oxygen Re-Evaluation):   Initial Exercise Prescription:  Initial Exercise Prescription - 12/04/24 1500       Date of Initial Exercise RX and Referring Provider   Date 12/04/24    Referring Provider Dr. Margery Ruth, MD      Oxygen   Maintain Oxygen Saturation 88% or higher      Recumbant Bike   Level 1    RPM 50    Watts 10    Minutes 15    METs 1.66      NuStep   Level 2    SPM 80    Minutes 15    METs 1.66      Track   Laps 18    Minutes 15    METs 1.98      Prescription Details   Frequency (times per week) 3    Duration Progress to 30 minutes of continuous aerobic without signs/symptoms of physical distress      Intensity   THRR 40-80% of Max Heartrate 100-128    Ratings of Perceived Exertion 11-13    Perceived Dyspnea 0-4      Progression   Progression Continue to progress workloads to maintain intensity without signs/symptoms of physical distress.      Resistance Training   Training Prescription Yes    Weight 3 lb    Reps 10-15          Perform Capillary Blood Glucose checks as needed.  Exercise Prescription Changes:   Exercise Prescription Changes     Row Name 12/04/24 1500             Response to Exercise   Blood Pressure (Admit) 146/82       Blood Pressure (Exercise) 168/88       Blood Pressure (Exit) 148/82       Heart Rate (Admit) 73 bpm       Heart Rate (Exercise) 113 bpm       Heart Rate (Exit) 87 bpm       Oxygen Saturation (Admit) 97 %       Oxygen Saturation (Exercise) 96 %       Rating of Perceived  Exertion (Exercise) 13       Perceived Dyspnea (Exercise) 0       Symptoms chest heaviness       Comments Results          Exercise Comments:   Exercise Comments     Row Name 12/18/24 1116           Exercise  Comments First full day of exercise!  Patient was oriented to gym and equipment including functions, settings, policies, and procedures.  Patient's individual exercise prescription and treatment plan were reviewed.  All starting workloads were established based on the results of the 6 minute walk test done at initial orientation visit.  The plan for exercise progression was also introduced and progression will be customized based on patient's performance and goals.          Exercise Goals and Review:   Exercise Goals     Row Name 12/04/24 1547             Exercise Goals   Increase Physical Activity Yes       Intervention Provide advice, education, support and counseling about physical activity/exercise needs.;Develop an individualized exercise prescription for aerobic and resistive training based on initial evaluation findings, risk stratification, comorbidities and participant's personal goals.       Expected Outcomes Short Term: Attend rehab on a regular basis to increase amount of physical activity.;Long Term: Add in home exercise to make exercise part of routine and to increase amount of physical activity.;Long Term: Exercising regularly at least 3-5 days a week.       Increase Strength and Stamina Yes       Intervention Provide advice, education, support and counseling about physical activity/exercise needs.;Develop an individualized exercise prescription for aerobic and resistive training based on initial evaluation findings, risk stratification, comorbidities and participant's personal goals.       Expected Outcomes Short Term: Increase workloads from initial exercise prescription for resistance, speed, and METs.;Long Term: Improve cardiorespiratory fitness,  muscular endurance and strength as measured by increased METs and functional capacity ( );Short Term: Perform resistance training exercises routinely during rehab and add in resistance training at home       Able to understand and use rate of perceived exertion (RPE) scale Yes       Intervention Provide education and explanation on how to use RPE scale       Expected Outcomes Short Term: Able to use RPE daily in rehab to express subjective intensity level;Long Term:  Able to use RPE to guide intensity level when exercising independently       Able to understand and use Dyspnea scale Yes       Intervention Provide education and explanation on how to use Dyspnea scale       Expected Outcomes Short Term: Able to use Dyspnea scale daily in rehab to express subjective sense of shortness of breath during exertion;Long Term: Able to use Dyspnea scale to guide intensity level when exercising independently       Knowledge and understanding of Target Heart Rate Range (THRR) Yes       Intervention Provide education and explanation of THRR including how the numbers were predicted and where they are located for reference       Expected Outcomes Short Term: Able to state/look up THRR;Long Term: Able to use THRR to govern intensity when exercising independently;Short Term: Able to use daily as guideline for intensity in rehab       Able to check pulse independently Yes       Intervention Provide education and demonstration on how to check pulse in carotid and radial arteries.;Review the importance of being able to check your own pulse for safety during independent exercise       Expected Outcomes Short Term: Able to explain why pulse checking is important during independent exercise;Long Term: Able to check  pulse independently and accurately       Understanding of Exercise Prescription Yes       Intervention Provide education, explanation, and written materials on patient's individual exercise prescription        Expected Outcomes Short Term: Able to explain program exercise prescription;Long Term: Able to explain home exercise prescription to exercise independently          Exercise Goals Re-Evaluation :  Exercise Goals Re-Evaluation     Row Name 12/18/24 1116             Exercise Goal Re-Evaluation   Exercise Goals Review Increase Physical Activity;Able to understand and use rate of perceived exertion (RPE) scale;Knowledge and understanding of Target Heart Rate Range (THRR);Understanding of Exercise Prescription;Increase Strength and Stamina;Able to understand and use Dyspnea scale;Able to check pulse independently       Comments Reviewed RPE and dyspnea scale, THR and program prescription with pt today.  Pt voiced understanding and was given a copy of goals to take home.       Expected Outcomes Short: Use RPE daily to regulate intensity. Long: Follow program prescription in THR.          Discharge Exercise Prescription (Final Exercise Prescription Changes):  Exercise Prescription Changes - 12/04/24 1500       Response to Exercise   Blood Pressure (Admit) 146/82    Blood Pressure (Exercise) 168/88    Blood Pressure (Exit) 148/82    Heart Rate (Admit) 73 bpm    Heart Rate (Exercise) 113 bpm    Heart Rate (Exit) 87 bpm    Oxygen Saturation (Admit) 97 %    Oxygen Saturation (Exercise) 96 %    Rating of Perceived Exertion (Exercise) 13    Perceived Dyspnea (Exercise) 0    Symptoms chest heaviness    Comments Results          Nutrition:  Target Goals: Understanding of nutrition guidelines, daily intake of sodium 1500mg , cholesterol 200mg , calories 30% from fat and 7% or less from saturated fats, daily to have 5 or more servings of fruits and vegetables.  Education: Nutrition 1 -Group instruction provided by verbal, written material, interactive activities, discussions, models, and posters to present general guidelines for heart healthy nutrition including macronutrients,  label reading, and promoting whole foods over processed counterparts. Education serves as pensions consultant of discussion of heart healthy eating for all. Written material provided at class time.    Education: Nutrition 2 -Group instruction provided by verbal, written material, interactive activities, discussions, models, and posters to present general guidelines for heart healthy nutrition including sodium, cholesterol, and saturated fat. Providing guidance of habit forming to improve blood pressure, cholesterol, and body weight. Written material provided at class time.     Biometrics:  Pre Biometrics - 12/04/24 1547       Pre Biometrics   Height 5' 4.25 (1.632 m)    Weight 211 lb 1.6 oz (95.8 kg)    Waist Circumference 44.5 inches    Hip Circumference 47 inches    Waist to Hip Ratio 0.95 %    BMI (Calculated) 35.95    Single Leg Stand 7.3 seconds           Nutrition Therapy Plan and Nutrition Goals:  Nutrition Therapy & Goals - 12/04/24 1150       Nutrition Therapy   Diet Cardiac, low Na    Protein (specify units) 70-90    Fiber 25 grams    Whole Grain Foods 3  servings    Saturated Fats 15 max. grams    Fruits and Vegetables 5 servings/day    Sodium 2 grams      Personal Nutrition Goals   Nutrition Goal Read labels and reduce sodium intake to below 2300mg . Ideally 1500mg  per day.    Personal Goal #2 Reduce saturated fat, less than 12g per day. Replace bad fats for more heart healthy fats.    Personal Goal #3 Eat 15-30gProtein and 30-60gCarbs at each meal.    Comments Patient drinking mostly water. She eats 3 times per day. She says she has been watching her salt intake for years. She tries to make foods at home with less salt. Reviewed mediterranean diet handout. Educated on types of fats, sources, and how to read labels. Brainstormed several meals and snacks with foods she likes and will eat.      Intervention Plan   Intervention Prescribe, educate and counsel regarding  individualized specific dietary modifications aiming towards targeted core components such as weight, hypertension, lipid management, diabetes, heart failure and other comorbidities.;Nutrition handout(s) given to patient.    Expected Outcomes Short Term Goal: Understand basic principles of dietary content, such as calories, fat, sodium, cholesterol and nutrients.;Short Term Goal: A plan has been developed with personal nutrition goals set during dietitian appointment.;Long Term Goal: Adherence to prescribed nutrition plan.          Nutrition Assessments:  MEDIFICTS Score Key: >=70 Need to make dietary changes  40-70 Heart Healthy Diet <= 40 Therapeutic Level Cholesterol Diet  Flowsheet Row Cardiac Rehab from 12/04/2024 in [facility] Cardiac and Pulmonary Rehab  Picture Your Plate Total Score on Admission 76   Picture Your Plate Scores: <59 Unhealthy dietary pattern with much room for improvement. 41-50 Dietary pattern unlikely to meet recommendations for good health and room for improvement. 51-60 More healthful dietary pattern, with some room for improvement.  >60 Healthy dietary pattern, although there may be some specific behaviors that could be improved.    Nutrition Goals Re-Evaluation:   Nutrition Goals Discharge (Final Nutrition Goals Re-Evaluation):   Psychosocial: Target Goals: Acknowledge presence or absence of significant depression and/or stress, maximize coping skills, provide positive support system. Participant is able to verbalize types and ability to use techniques and skills needed for reducing stress and depression.   Education: Stress, Anxiety, and Depression - Group verbal and visual presentation to define topics covered.  Reviews how body is impacted by stress, anxiety, and depression.  Also discusses healthy ways to reduce stress and to treat/manage anxiety and depression. Written material provided at class time.   Education: Sleep Hygiene -Provides group verbal  and written instruction about how sleep can affect your health.  Define sleep hygiene, discuss sleep cycles and impact of sleep habits. Review good sleep hygiene tips.   Initial Review & Psychosocial Screening:  Initial Psych Review & Screening - 12/04/24 1311       Initial Review   Current issues with Current Stress Concerns      Family Dynamics   Good Support System? Yes      Barriers   Psychosocial barriers to participate in program There are no identifiable barriers or psychosocial needs.;The patient should benefit from training in stress management and relaxation.      Screening Interventions   Interventions Encouraged to exercise;Provide feedback about the scores to participant;To provide support and resources with identified psychosocial needs    Expected Outcomes Short Term goal: Utilizing psychosocial counselor, staff and physician to assist with identification  of specific Stressors or current issues interfering with healing process. Setting desired goal for each stressor or current issue identified.;Long Term Goal: Stressors or current issues are controlled or eliminated.;Short Term goal: Identification and review with participant of any Quality of Life or Depression concerns found by scoring the questionnaire.;Long Term goal: The participant improves quality of Life and PHQ9 Scores as seen by post scores and/or verbalization of changes          Quality of Life Scores:   Quality of Life - 12/04/24 1319       Quality of Life   Select Quality of Life      Quality of Life Scores   Health/Function Pre 27 %    Socioeconomic Pre 27.43 %    Psych/Spiritual Pre 24.86 %    Family Pre 27 %    GLOBAL Pre 26.6 %         Scores of 19 and below usually indicate a poorer quality of life in these areas.  A difference of  2-3 points is a clinically meaningful difference.  A difference of 2-3 points in the total score of the Quality of Life Index has been associated with significant  improvement in overall quality of life, self-image, physical symptoms, and general health in studies assessing change in quality of life.  PHQ-9: Review Flowsheet       12/04/2024  Depression screen PHQ 2/9  Decreased Interest 3  Down, Depressed, Hopeless 0  PHQ - 2 Score 3  Altered sleeping 0  Tired, decreased energy 1  Change in appetite 0  Feeling bad or failure about yourself  1  Trouble concentrating 0  Moving slowly or fidgety/restless 0  Suicidal thoughts 0  PHQ-9 Score 5  Difficult doing work/chores Not difficult at all   Interpretation of Total Score  Total Score Depression Severity:  1-4 = Minimal depression, 5-9 = Mild depression, 10-14 = Moderate depression, 15-19 = Moderately severe depression, 20-27 = Severe depression   Psychosocial Evaluation and Intervention:  Psychosocial Evaluation - 12/04/24 1311       Psychosocial Evaluation & Interventions   Interventions Relaxation education;Encouraged to exercise with the program and follow exercise prescription    Comments Ms. Elmendorf is coming to cardiac rehab post NSTEMI. She states she is feeling better since her hospitalization, but finds herself more tired than usual. She is contributing it to medication and busy season of life, but it does have her worried some days. Encouraged her to talk to her doctor about concerns with tiredness. 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 She enjoys staying busy at her job. She has been trying to eat smaller, frequent meals which has helped with her stamina in the morning. She has a support system in her family. She wants to come to the program to work on stamina and strength    Expected Outcomes Short: attend cardiac rehab for education and exercise Long: Develop and maintain positive self care habits.    Continue Psychosocial Services  Follow up required by staff           Psychosocial Re-Evaluation:   Psychosocial Discharge (Final Psychosocial Re-Evaluation):   Vocational Rehabilitation: Provide vocational rehab assistance to qualifying candidates.   Vocational Rehab Evaluation & Intervention:  Vocational Rehab - 12/04/24 1311       Initial Vocational Rehab Evaluation & Intervention   Assessment shows  need for Vocational Rehabilitation No          Education: Education Goals: Education classes will be provided on a variety of topics geared toward better understanding of heart health and risk factor modification. Participant will state understanding/return demonstration of topics presented as noted by education test scores.  Learning Barriers/Preferences:  Learning Barriers/Preferences - 12/04/24 1044       Learning Barriers/Preferences   Learning Barriers None    Learning Preferences Individual Instruction          General Cardiac Education Topics:  AED/CPR: - Group verbal and written instruction with the use of models to demonstrate the basic use of the AED with the basic ABC's of resuscitation.   Test and Procedures: - Group verbal and visual presentation and models provide information about basic cardiac anatomy and function. Reviews the testing methods done to diagnose heart disease and the outcomes of the test results. Describes the treatment choices: Medical Management, Angioplasty, or Coronary Bypass Surgery for treating various heart conditions including Myocardial Infarction, Angina, Valve Disease, and Cardiac Arrhythmias. Written material provided at class time.   Medication Safety: - Group verbal and visual instruction to review commonly prescribed medications for heart and lung disease. Reviews the medication, class of the drug, and side effects. Includes the steps to properly store meds and maintain the prescription regimen. Written material provided at class time.   Intimacy: - Group verbal instruction through game  format to discuss how heart and lung disease can affect sexual intimacy. Written material provided at class time.   Know Your Numbers and Heart Failure: - Group verbal and visual instruction to discuss disease risk factors for cardiac and pulmonary disease and treatment options.  Reviews associated critical values for Overweight/Obesity, Hypertension, Cholesterol, and Diabetes.  Discusses basics of heart failure: signs/symptoms and treatments.  Introduces Heart Failure Zone chart for action plan for heart failure. Written material provided at class time.   Infection Prevention: - Provides verbal and written material to individual with discussion of infection control including proper hand washing and proper equipment cleaning during exercise session.   Falls Prevention: - Provides verbal and written material to individual with discussion of falls prevention and safety.   Other: -Provides group and verbal instruction on various topics (see comments)   Knowledge Questionnaire Score:  Knowledge Questionnaire Score - 12/04/24 1328       Knowledge Questionnaire Score   Pre Score 22/26          Core Components/Risk Factors/Patient Goals at Admission:  Personal Goals and Risk Factors at Admission - 12/04/24 1040       Core Components/Risk Factors/Patient Goals on Admission    Weight Management Yes;Weight Loss    Intervention Weight Management: Develop a combined nutrition and exercise program designed to reach desired caloric intake, while maintaining appropriate intake of nutrient and fiber, sodium and fats, and appropriate energy expenditure required for the weight goal.;Weight Management: Provide education and appropriate resources to help participant work on and attain dietary goals.;Weight Management/Obesity: Establish reasonable short term and long term weight goals.;Obesity: Provide education and appropriate resources to help participant work on and attain dietary goals.    Goal  Weight: Long Term 190 lb (86.2 kg)    Expected Outcomes Short Term: Continue to assess and modify interventions until short term weight is achieved;Long Term: Adherence to nutrition and physical activity/exercise program aimed toward attainment of established weight goal;Weight Loss: Understanding of general recommendations for a balanced deficit meal plan, which promotes 1-2 lb  weight loss per week and includes a negative energy balance of [phone] kcal/d;Understanding recommendations for meals to include 15-35% energy as protein, 25-35% energy from fat, 35-60% energy from carbohydrates, less than 200mg  of dietary cholesterol, 20-35 gm of total fiber daily;Understanding of distribution of calorie intake throughout the day with the consumption of 4-5 meals/snacks    Hypertension Yes    Intervention Provide education on lifestyle modifcations including regular physical activity/exercise, weight management, moderate sodium restriction and increased consumption of fresh fruit, vegetables, and low fat dairy, alcohol moderation, and smoking cessation.;Monitor prescription use compliance.    Expected Outcomes Short Term: Continued assessment and intervention until BP is < 140/30mm HG in hypertensive participants. < 130/3mm HG in hypertensive participants with diabetes, heart failure or chronic kidney disease.;Long Term: Maintenance of blood pressure at goal levels.    Lipids Yes    Intervention Provide education and support for participant on nutrition & aerobic/resistive exercise along with prescribed medications to achieve LDL 70mg , HDL >40mg .    Expected Outcomes Short Term: Participant states understanding of desired cholesterol values and is compliant with medications prescribed. Participant is following exercise prescription and nutrition guidelines.;Long Term: Cholesterol controlled with medications as prescribed, with individualized exercise RX and with personalized nutrition plan. Value goals: LDL <  70mg , HDL > 40 mg.          Education:Diabetes - Individual verbal and written instruction to review signs/symptoms of diabetes, desired ranges of glucose level fasting, after meals and with exercise. Acknowledge that pre and post exercise glucose checks will be done for 3 sessions at entry of program.   Core Components/Risk Factors/Patient Goals Review:    Core Components/Risk Factors/Patient Goals at Discharge (Final Review):    ITP Comments:  ITP Comments     Row Name 12/04/24 1158 12/18/24 1116 12/20/24 0844       ITP Comments Completed program orientation and . Initial ITP created and sent for review to Medical Director. First full day of exercise!  Patient was oriented to gym and equipment including functions, settings, policies, and procedures.  Patient's individual exercise prescription and treatment plan were reviewed.  All starting workloads were established based on the results of the 6 minute walk test done at initial orientation visit.  The plan for exercise progression was also introduced and progression will be customized based on patient's performance and goals. 30 Day review completed. Medical Director ITP review done, changes made as directed, and signed approval by Medical Director. New to program.        Comments: 30 day review     [1]  Current Outpatient Medications:    atorvastatin (LIPITOR) 40 MG tablet, Take 40 mg by mouth daily., Disp: , Rfl:    calcium carbonate (OSCAL) 1500 (600 Ca) MG TABS tablet, Take 600 mg by mouth 2 (two) times daily., Disp: , Rfl:    cetirizine (ZYRTEC) 10 MG tablet, Take 10 mg by mouth daily., Disp: , Rfl:    Cholecalciferol (VITAMIN D3) 50 MCG (2000 UT) TABS, Take 2,000 Units by mouth 2 (two) times daily., Disp: , Rfl:    diclofenac Sodium (VOLTAREN) 1 % GEL, Apply 2 g topically., Disp: , Rfl:    fluticasone (FLONASE) 50 MCG/ACT nasal spray, Place 1 spray into both nostrils 2 (two) times daily. , Disp: , Rfl:    metoprolol  succinate (TOPROL-XL) 25 MG 24 hr tablet, Take 50 mg by mouth., Disp: , Rfl:    Multiple Vitamin (MULTIVITAMIN WITH MINERALS) TABS tablet, Take 1 tablet  by mouth daily., Disp: , Rfl:    polyethylene glycol (MIRALAX / GLYCOLAX) 17 g packet, Take 17 g by mouth daily as needed., Disp: , Rfl:    senna (SENOKOT) 8.6 MG TABS tablet, Take 1 tablet by mouth daily., Disp: , Rfl:    SYSTANE 0.4-0.3 % SOLN, Place 1 drop into both eyes 2 (two) times daily., Disp: , Rfl:    warfarin (COUMADIN) 1 MG tablet, Take 1 mg by mouth every Monday, Tuesday, Wednesday, Thursday, and Friday. In the evening., Disp: , Rfl:    warfarin (COUMADIN) 6 MG tablet, Take 6 mg by mouth every evening. Take 1 tablet (6 mg) by mouth with a 1 mg tablet on Mondays, Tuesdays, Wednesdays, Thursdays, & Fridays for total daily dose=7 mg Take 1 tablet (6mg ) by mouth on Sundays & Saturday for total daily dose=6 mg, Disp: , Rfl:  [2]  Social History Tobacco Use  Smoking Status Never  Smokeless Tobacco Never

## 2024-12-20 NOTE — Progress Notes (Signed)
 Daily Session Note  Patient Details  Name: Lauren Brooks MRN: [id number] Date of Birth: [birth year] Referring Provider:   Flowsheet Row Cardiac Rehab from 12/04/2024 in [facility] Cardiac and Pulmonary Rehab  Referring Provider Dr. Margery Ruth, MD    Encounter Date: 12/20/2024  Check In:  Session Check In - 12/20/24 1048       Check-In   Supervising physician immediately available to respond to emergencies See telemetry face sheet for immediately available ER MD    Location [facility]-Cardiac & Pulmonary Rehab    Staff Present Leita Franks RN,BSN;Joseph [employer] BS, Exercise Physiologist;Margaret Best, MS, Exercise Physiologist    Virtual Visit No    Medication changes reported     No    Fall or balance concerns reported    No    Warm-up and Cool-down Performed on first and last piece of equipment    Resistance Training Performed Yes    VAD Patient? No    PAD/SET Patient? No      Pain Assessment   Currently in Pain? No/denies             Tobacco Use History[1]  Goals Met:  Independence with exercise equipment Exercise tolerated well No report of concerns or symptoms today Strength training completed today  Goals Unmet:  Not Applicable  Comments: Pt able to follow exercise prescription today without complaint.  Will continue to monitor for progression.    Dr. Oneil Pinal is Medical Director for [facility] Cardiac Rehabilitation.  Dr. Fuad Aleskerov is Medical Director for [facility] Pulmonary Rehabilitation.    [1]  Social History Tobacco Use  Smoking Status Never  Smokeless Tobacco Never

## 2024-12-22 ENCOUNTER — Encounter

## 2024-12-22 DIAGNOSIS — I214 Non-ST elevation (NSTEMI) myocardial infarction: Secondary | ICD-10-CM

## 2024-12-22 DIAGNOSIS — Z5189 Encounter for other specified aftercare: Secondary | ICD-10-CM | POA: Diagnosis not present

## 2024-12-22 NOTE — Progress Notes (Signed)
 Daily Session Note  Patient Details  Name: Lauren Brooks MRN: [id number] Date of Birth: Apr 05, 1946 Referring Provider:   Flowsheet Row Cardiac Rehab from 12/04/2024 in [facility] Cardiac and Pulmonary Rehab  Referring Provider Dr. Margery Ruth, MD    Encounter Date: 12/22/2024  Check In:  Session Check In - 12/22/24 1057       Check-In   Supervising physician immediately available to respond to emergencies See telemetry face sheet for immediately available ER MD    Location [facility]-Cardiac & Pulmonary Rehab    Staff Present Burnard Davenport RN,BSN,MPA;Maxon Conetta BS, Exercise Physiologist;Joseph Hood RCP,RRT,BSRT;Noah Tickle, BS, Exercise Physiologist    Virtual Visit No    Medication changes reported     No    Fall or balance concerns reported    No    Warm-up and Cool-down Performed on first and last piece of equipment    Resistance Training Performed Yes    VAD Patient? No    PAD/SET Patient? No      Pain Assessment   Currently in Pain? No/denies             Tobacco Use History[1]  Goals Met:  Independence with exercise equipment Exercise tolerated well No report of concerns or symptoms today Strength training completed today  Goals Unmet:  Not Applicable  Comments: Pt able to follow exercise prescription today without complaint.  Will continue to monitor for progression.    Dr. Oneil Pinal is Medical Director for [facility] Cardiac Rehabilitation.  Dr. Fuad Aleskerov is Medical Director for [facility] Pulmonary Rehabilitation.    [1]  Social History Tobacco Use  Smoking Status Never  Smokeless Tobacco Never

## 2024-12-25 ENCOUNTER — Encounter

## 2024-12-25 DIAGNOSIS — I214 Non-ST elevation (NSTEMI) myocardial infarction: Secondary | ICD-10-CM

## 2024-12-25 DIAGNOSIS — Z5189 Encounter for other specified aftercare: Secondary | ICD-10-CM | POA: Diagnosis not present

## 2024-12-25 NOTE — Progress Notes (Signed)
 Daily Session Note  Patient Details  Name: Lauren Brooks MRN: [id number] Date of Birth: 09-Feb-1946 Referring Provider:   Flowsheet Row Cardiac Rehab from 12/04/2024 in [facility] Cardiac and Pulmonary Rehab  Referring Provider Dr. Margery Ruth, MD    Encounter Date: 12/25/2024  Check In:  Session Check In - 12/25/24 1054       Check-In   Supervising physician immediately available to respond to emergencies See telemetry face sheet for immediately available ER MD    Location [facility]-Cardiac & Pulmonary Rehab    Staff Present Burnard Davenport RN,BSN,MPA;Joseph Rolinda RCP,RRT,BSRT;Laura Cates RN,BSN;Coby Antrobus Dyane BS, ACSM CEP, Exercise Physiologist    Virtual Visit No    Medication changes reported     No    Fall or balance concerns reported    No    Warm-up and Cool-down Performed on first and last piece of equipment    Resistance Training Performed Yes    VAD Patient? No    PAD/SET Patient? No      Pain Assessment   Currently in Pain? No/denies             Tobacco Use History[1]  Goals Met:  Independence with exercise equipment Exercise tolerated well No report of concerns or symptoms today Strength training completed today  Goals Unmet:  Not Applicable  Comments: Pt able to follow exercise prescription today without complaint.  Will continue to monitor for progression.    Dr. Oneil Pinal is Medical Director for [facility] Cardiac Rehabilitation.  Dr. Fuad Aleskerov is Medical Director for [facility] Pulmonary Rehabilitation.    [1]  Social History Tobacco Use  Smoking Status Never  Smokeless Tobacco Never

## 2024-12-27 ENCOUNTER — Encounter: Admitting: Emergency Medicine

## 2024-12-27 DIAGNOSIS — Z5189 Encounter for other specified aftercare: Secondary | ICD-10-CM | POA: Diagnosis not present

## 2024-12-27 DIAGNOSIS — I214 Non-ST elevation (NSTEMI) myocardial infarction: Secondary | ICD-10-CM

## 2024-12-27 NOTE — Progress Notes (Signed)
 Daily Session Note  Patient Details  Name: Lauren Brooks MRN: [id number] Date of Birth: October 30, 1946 Referring Provider:   Flowsheet Row Cardiac Rehab from 12/04/2024 in [facility] Cardiac and Pulmonary Rehab  Referring Provider Dr. Margery Ruth, MD    Encounter Date: 12/27/2024  Check In:  Session Check In - 12/27/24 1043       Check-In   Supervising physician immediately available to respond to emergencies See telemetry face sheet for immediately available ER MD    Location [facility]-Cardiac & Pulmonary Rehab    Staff Present Leita Franks RN,BSN;Joseph [facility] BS, Exercise Physiologist;Margaret Best, MS, Exercise Physiologist    Virtual Visit No    Medication changes reported     No    Fall or balance concerns reported    No    Warm-up and Cool-down Performed on first and last piece of equipment    Resistance Training Performed Yes    VAD Patient? No    PAD/SET Patient? No      Pain Assessment   Currently in Pain? No/denies             Tobacco Use History[1]  Goals Met:  Independence with exercise equipment Exercise tolerated well No report of concerns or symptoms today Strength training completed today  Goals Unmet:  Not Applicable  Comments: Pt able to follow exercise prescription today without complaint.  Will continue to monitor for progression.    Dr. Oneil Pinal is Medical Director for [facility] Cardiac Rehabilitation.  Dr. Fuad Aleskerov is Medical Director for [facility] Pulmonary Rehabilitation.    [1]  Social History Tobacco Use  Smoking Status Never  Smokeless Tobacco Never

## 2024-12-29 ENCOUNTER — Encounter

## 2024-12-29 DIAGNOSIS — Z5189 Encounter for other specified aftercare: Secondary | ICD-10-CM | POA: Diagnosis not present

## 2024-12-29 DIAGNOSIS — I214 Non-ST elevation (NSTEMI) myocardial infarction: Secondary | ICD-10-CM

## 2024-12-29 NOTE — Progress Notes (Signed)
 Daily Session Note  Patient Details  Name: Lauren Brooks MRN: [id number] Date of Birth: 16-Jun-1946 Referring Provider:   Flowsheet Row Cardiac Rehab from 12/04/2024 in [facility] Cardiac and Pulmonary Rehab  Referring Provider Dr. Margery Ruth, MD    Encounter Date: 12/29/2024  Check In:  Session Check In - 12/29/24 1121       Check-In   Supervising physician immediately available to respond to emergencies See telemetry face sheet for immediately available ER MD    Location [facility]-Cardiac & Pulmonary Rehab    Staff Present Burnard Davenport RN,BSN,MPA;Maxon Conetta BS, Exercise Physiologist;Joseph Hood RCP,RRT,BSRT;Noah Tickle, [place], Exercise Physiologist    Virtual Visit No    Medication changes reported     No    Fall or balance concerns reported    No    Warm-up and Cool-down Performed on first and last piece of equipment    Resistance Training Performed Yes    VAD Patient? No    PAD/SET Patient? No      Pain Assessment   Currently in Pain? No/denies             Tobacco Use History[1]  Goals Met:  Independence with exercise equipment Exercise tolerated well No report of concerns or symptoms today Strength training completed today  Goals Unmet:  Not Applicable  Comments: Pt able to follow exercise prescription today without complaint.  Will continue to monitor for progression.    Dr. Oneil Pinal is Medical Director for [facility] Cardiac Rehabilitation.  Dr. Fuad Aleskerov is Medical Director for [facility] Pulmonary Rehabilitation.    [1]  Social History Tobacco Use  Smoking Status Never  Smokeless Tobacco Never

## 2025-01-01 ENCOUNTER — Encounter

## 2025-01-03 ENCOUNTER — Encounter

## 2025-01-03 DIAGNOSIS — Z5189 Encounter for other specified aftercare: Secondary | ICD-10-CM | POA: Diagnosis not present

## 2025-01-03 DIAGNOSIS — I214 Non-ST elevation (NSTEMI) myocardial infarction: Secondary | ICD-10-CM

## 2025-01-03 NOTE — Progress Notes (Signed)
 Daily Session Note  Patient Details  Name: LUBERTHA LEITE MRN: [id number] Date of Birth: 25-Jan-1946 Referring Provider:   Flowsheet Row Cardiac Rehab from 12/04/2024 in [facility] Cardiac and Pulmonary Rehab  Referring Provider Dr. Margery Ruth, MD    Encounter Date: 01/03/2025  Check In:  Session Check In - 01/03/25 1059       Check-In   Supervising physician immediately available to respond to emergencies See telemetry face sheet for immediately available ER MD    Location [facility]-Cardiac & Pulmonary Rehab    Staff Present Burnard Davenport RN,BSN,MPA;Laura Cates RN,BSN;Margaret Best, MS, Exercise Physiologist;Noah Tickle, BS, Exercise Physiologist    Virtual Visit No    Medication changes reported     No    Fall or balance concerns reported    No    Warm-up and Cool-down Performed on first and last piece of equipment    VAD Patient? No    PAD/SET Patient? No      Pain Assessment   Currently in Pain? No/denies             Tobacco Use History[1]  Goals Met:  Independence with exercise equipment Exercise tolerated well No report of concerns or symptoms today Strength training completed today  Goals Unmet:  Not Applicable  Comments: Pt able to follow exercise prescription today without complaint.  Will continue to monitor for progression.    Dr. Oneil Pinal is Medical Director for [facility] Cardiac Rehabilitation.  Dr. Fuad Aleskerov is Medical Director for [facility] Pulmonary Rehabilitation.    [1]  Social History Tobacco Use  Smoking Status Never  Smokeless Tobacco Never

## 2025-01-05 ENCOUNTER — Encounter

## 2025-01-08 ENCOUNTER — Encounter

## 2025-01-10 ENCOUNTER — Encounter

## 2025-01-12 ENCOUNTER — Encounter: Attending: Cardiology

## 2025-01-12 DIAGNOSIS — I214 Non-ST elevation (NSTEMI) myocardial infarction: Secondary | ICD-10-CM

## 2025-01-12 NOTE — Progress Notes (Signed)
 Daily Session Note  Patient Details  Name: Lauren Brooks MRN: [id number] Date of Birth: 1946/07/09 Referring Provider:   Flowsheet Row Cardiac Rehab from 12/04/2024 in [facility] Cardiac and Pulmonary Rehab  Referring Provider Dr. Margery Ruth, MD    Encounter Date: 01/12/2025  Check In:  Session Check In - 01/12/25 1059       Check-In   Supervising physician immediately available to respond to emergencies See telemetry face sheet for immediately available ER MD    Location [facility]-Cardiac & Pulmonary Rehab    Staff Present Burnard Davenport RN,BSN,MPA;Maxon Conetta BS, Exercise Physiologist;Joseph Hood RCP,RRT,BSRT;Noah Tickle, [place], Exercise Physiologist    Virtual Visit No    Medication changes reported     No    Fall or balance concerns reported    No    Warm-up and Cool-down Performed on first and last piece of equipment    Resistance Training Performed Yes    VAD Patient? No    PAD/SET Patient? No      Pain Assessment   Currently in Pain? No/denies             Tobacco Use History[1]  Goals Met:  Independence with exercise equipment Exercise tolerated well No report of concerns or symptoms today Strength training completed today  Goals Unmet:  Not Applicable  Comments: Pt able to follow exercise prescription today without complaint.  Will continue to monitor for progression.    Dr. Oneil Pinal is Medical Director for [facility] Cardiac Rehabilitation.  Dr. Fuad Aleskerov is Medical Director for [facility] Pulmonary Rehabilitation.    [1]  Social History Tobacco Use  Smoking Status Never  Smokeless Tobacco Never

## 2025-01-15 ENCOUNTER — Encounter

## 2025-01-17 ENCOUNTER — Encounter

## 2025-01-19 ENCOUNTER — Encounter

## 2025-01-22 ENCOUNTER — Encounter

## 2025-01-24 ENCOUNTER — Encounter

## 2025-01-26 ENCOUNTER — Encounter

## 2025-01-29 ENCOUNTER — Encounter

## 2025-01-31 ENCOUNTER — Encounter

## 2025-02-02 ENCOUNTER — Encounter

## 2025-02-05 ENCOUNTER — Encounter: Attending: Cardiology

## 2025-02-07 ENCOUNTER — Encounter

## 2025-02-09 ENCOUNTER — Encounter

## 2025-02-12 ENCOUNTER — Encounter

## 2025-02-14 ENCOUNTER — Encounter

## 2025-02-16 ENCOUNTER — Encounter

## 2025-02-19 ENCOUNTER — Encounter

## 2025-02-21 ENCOUNTER — Encounter

## 2025-02-23 ENCOUNTER — Encounter

## 2025-02-26 ENCOUNTER — Encounter

## 2025-02-28 ENCOUNTER — Encounter

## 2025-03-02 ENCOUNTER — Encounter
